# Patient Record
Sex: Female | Born: 1955
Health system: Southern US, Community
[De-identification: ages and names within clinical notes are randomized; demographics above are authoritative.]

## PROBLEM LIST (undated history)

## (undated) DIAGNOSIS — K219 Gastro-esophageal reflux disease without esophagitis: Secondary | ICD-10-CM

## (undated) DIAGNOSIS — I10 Essential (primary) hypertension: Secondary | ICD-10-CM

## (undated) DIAGNOSIS — N390 Urinary tract infection, site not specified: Secondary | ICD-10-CM

## (undated) HISTORY — DX: Gastro-esophageal reflux disease without esophagitis: K21.9

## (undated) HISTORY — DX: Urinary tract infection, site not specified: N39.0

## (undated) HISTORY — DX: Essential (primary) hypertension: I10

---

## 1999-09-26 ENCOUNTER — Other Ambulatory Visit: Admission: RE | Admit: 1999-09-26 | Discharge: 1999-09-26 | Payer: Self-pay | Admitting: Obstetrics & Gynecology

## 2003-07-13 ENCOUNTER — Other Ambulatory Visit: Admission: RE | Admit: 2003-07-13 | Discharge: 2003-07-13 | Payer: Self-pay | Admitting: Obstetrics & Gynecology

## 2003-07-13 LAB — HM MAMMOGRAPHY

## 2003-07-13 LAB — HM PAP SMEAR

## 2019-01-31 ENCOUNTER — Ambulatory Visit: Payer: Self-pay | Admitting: Family Medicine

## 2019-02-07 ENCOUNTER — Ambulatory Visit (INDEPENDENT_AMBULATORY_CARE_PROVIDER_SITE_OTHER): Payer: BC Managed Care – PPO | Admitting: Family Medicine

## 2019-02-07 ENCOUNTER — Encounter: Payer: Self-pay | Admitting: Family Medicine

## 2019-02-07 ENCOUNTER — Other Ambulatory Visit: Payer: Self-pay

## 2019-02-07 VITALS — BP 130/100 | HR 89 | Temp 98.3°F | Ht 65.75 in | Wt 209.8 lb

## 2019-02-07 DIAGNOSIS — R030 Elevated blood-pressure reading, without diagnosis of hypertension: Secondary | ICD-10-CM

## 2019-02-07 DIAGNOSIS — Z0000 Encounter for general adult medical examination without abnormal findings: Secondary | ICD-10-CM | POA: Diagnosis not present

## 2019-02-07 DIAGNOSIS — Z23 Encounter for immunization: Secondary | ICD-10-CM

## 2019-02-07 DIAGNOSIS — Z114 Encounter for screening for human immunodeficiency virus [HIV]: Secondary | ICD-10-CM | POA: Diagnosis not present

## 2019-02-07 DIAGNOSIS — Z1159 Encounter for screening for other viral diseases: Secondary | ICD-10-CM | POA: Diagnosis not present

## 2019-02-07 DIAGNOSIS — Z1211 Encounter for screening for malignant neoplasm of colon: Secondary | ICD-10-CM

## 2019-02-07 LAB — CBC WITH DIFFERENTIAL/PLATELET
Basophils Absolute: 0 10*3/uL (ref 0.0–0.1)
Basophils Relative: 0.6 % (ref 0.0–3.0)
Eosinophils Absolute: 0.1 10*3/uL (ref 0.0–0.7)
Eosinophils Relative: 1.1 % (ref 0.0–5.0)
HCT: 44.3 % (ref 36.0–46.0)
Hemoglobin: 14.6 g/dL (ref 12.0–15.0)
Lymphocytes Relative: 30.3 % (ref 12.0–46.0)
Lymphs Abs: 2 10*3/uL (ref 0.7–4.0)
MCHC: 33 g/dL (ref 30.0–36.0)
MCV: 93.7 fl (ref 78.0–100.0)
Monocytes Absolute: 0.4 10*3/uL (ref 0.1–1.0)
Monocytes Relative: 5.9 % (ref 3.0–12.0)
Neutro Abs: 4.2 10*3/uL (ref 1.4–7.7)
Neutrophils Relative %: 62.1 % (ref 43.0–77.0)
Platelets: 269 10*3/uL (ref 150.0–400.0)
RBC: 4.73 Mil/uL (ref 3.87–5.11)
RDW: 13.6 % (ref 11.5–15.5)
WBC: 6.8 10*3/uL (ref 4.0–10.5)

## 2019-02-07 LAB — TSH: TSH: 2.01 u[IU]/mL (ref 0.35–4.50)

## 2019-02-07 LAB — LIPID PANEL
Cholesterol: 194 mg/dL (ref 0–200)
HDL: 53.7 mg/dL (ref >39.00)
LDL Cholesterol: 123 mg/dL — ABNORMAL HIGH (ref 0–99)
NonHDL: 139.86
Total CHOL/HDL Ratio: 4
Triglycerides: 83 mg/dL (ref 0.0–149.0)
VLDL: 16.6 mg/dL (ref 0.0–40.0)

## 2019-02-07 LAB — COMPREHENSIVE METABOLIC PANEL
ALT: 33 U/L (ref 0–35)
AST: 25 U/L (ref 0–37)
Albumin: 4.5 g/dL (ref 3.5–5.2)
Alkaline Phosphatase: 67 U/L (ref 39–117)
BUN: 16 mg/dL (ref 6–23)
CO2: 27 mEq/L (ref 19–32)
Calcium: 10.4 mg/dL (ref 8.4–10.5)
Chloride: 106 mEq/L (ref 96–112)
Creatinine, Ser: 0.9 mg/dL (ref 0.40–1.20)
GFR: 63.42 mL/min (ref >60.00)
Glucose, Bld: 97 mg/dL (ref 70–99)
Potassium: 4 mEq/L (ref 3.5–5.1)
Sodium: 142 mEq/L (ref 135–145)
Total Bilirubin: 0.4 mg/dL (ref 0.2–1.2)
Total Protein: 6.9 g/dL (ref 6.0–8.3)

## 2019-02-07 NOTE — Patient Instructions (Signed)
Will see you back in 3 months time for blood pressure check. Keep a log for me and bring cuff. If is going up, please let me know before your follow up.   Max heart rate that you should be at is 158 bpm. (220-age)  Get mmg-sheet given. Labs today. cologaurd ordered for colon cancer screening.  Come back for pap smear!   So nice to meet you!   Managing Your Hypertension Hypertension is commonly called high blood pressure. This is when the force of your blood pressing against the walls of your arteries is too strong. Arteries are blood vessels that carry blood from your heart throughout your body. Hypertension forces the heart to work harder to pump blood, and may cause the arteries to become narrow or stiff. Having untreated or uncontrolled hypertension can cause heart attack, stroke, kidney disease, and other problems. What are blood pressure readings? A blood pressure reading consists of a higher number over a lower number. Ideally, your blood pressure should be below 120/80. The first ("top") number is called the systolic pressure. It is a measure of the pressure in your arteries as your heart beats. The second ("bottom") number is called the diastolic pressure. It is a measure of the pressure in your arteries as the heart relaxes. What does my blood pressure reading mean? Blood pressure is classified into four stages. Based on your blood pressure reading, your health care provider may use the following stages to determine what type of treatment you need, if any. Systolic pressure and diastolic pressure are measured in a unit called mm Hg. Normal  Systolic pressure: below 120.  Diastolic pressure: below 80. Elevated  Systolic pressure: 120-129.  Diastolic pressure: below 80. Hypertension stage 1  Systolic pressure: 130-139.  Diastolic pressure: 80-89. Hypertension stage 2  Systolic pressure: 140 or above.  Diastolic pressure: 90 or above. What health risks are associated with  hypertension? Managing your hypertension is an important responsibility. Uncontrolled hypertension can lead to:  A heart attack.  A stroke.  A weakened blood vessel (aneurysm).  Heart failure.  Kidney damage.  Eye damage.  Metabolic syndrome.  Memory and concentration problems. What changes can I make to manage my hypertension? Hypertension can be managed by making lifestyle changes and possibly by taking medicines. Your health care provider will help you make a plan to bring your blood pressure within a normal range. Eating and drinking   Eat a diet that is high in fiber and potassium, and low in salt (sodium), added sugar, and fat. An example eating plan is called the DASH (Dietary Approaches to Stop Hypertension) diet. To eat this way: ? Eat plenty of fresh fruits and vegetables. Try to fill half of your plate at each meal with fruits and vegetables. ? Eat whole grains, such as whole wheat pasta, brown rice, or whole grain bread. Fill about one quarter of your plate with whole grains. ? Eat low-fat diary products. ? Avoid fatty cuts of meat, processed or cured meats, and poultry with skin. Fill about one quarter of your plate with lean proteins such as fish, chicken without skin, beans, eggs, and tofu. ? Avoid premade and processed foods. These tend to be higher in sodium, added sugar, and fat.  Reduce your daily sodium intake. Most people with hypertension should eat less than 1,500 mg of sodium a day.  Limit alcohol intake to no more than 1 drink a day for nonpregnant women and 2 drinks a day for men. One drink  equals 12 oz of beer, 5 oz of wine, or 1 oz of hard liquor. Lifestyle  Work with your health care provider to maintain a healthy body weight, or to lose weight. Ask what an ideal weight is for you.  Get at least 30 minutes of exercise that causes your heart to beat faster (aerobic exercise) most days of the week. Activities may include walking, swimming, or biking.   Include exercise to strengthen your muscles (resistance exercise), such as weight lifting, as part of your weekly exercise routine. Try to do these types of exercises for 30 minutes at least 3 days a week.  Do not use any products that contain nicotine or tobacco, such as cigarettes and e-cigarettes. If you need help quitting, ask your health care provider.  Control any long-term (chronic) conditions you have, such as high cholesterol or diabetes. Monitoring  Monitor your blood pressure at home as told by your health care provider. Your personal target blood pressure may vary depending on your medical conditions, your age, and other factors.  Have your blood pressure checked regularly, as often as told by your health care provider. Working with your health care provider  Review all the medicines you take with your health care provider because there may be side effects or interactions.  Talk with your health care provider about your diet, exercise habits, and other lifestyle factors that may be contributing to hypertension.  Visit your health care provider regularly. Your health care provider can help you create and adjust your plan for managing hypertension. Will I need medicine to control my blood pressure? Your health care provider may prescribe medicine if lifestyle changes are not enough to get your blood pressure under control, and if:  Your systolic blood pressure is 130 or higher.  Your diastolic blood pressure is 80 or higher. Take medicines only as told by your health care provider. Follow the directions carefully. Blood pressure medicines must be taken as prescribed. The medicine does not work as well when you skip doses. Skipping doses also puts you at risk for problems. Contact a health care provider if:  You think you are having a reaction to medicines you have taken.  You have repeated (recurrent) headaches.  You feel dizzy.  You have swelling in your ankles.  You  have trouble with your vision. Get help right away if:  You develop a severe headache or confusion.  You have unusual weakness or numbness, or you feel faint.  You have severe pain in your chest or abdomen.  You vomit repeatedly.  You have trouble breathing. Summary  Hypertension is when the force of blood pumping through your arteries is too strong. If this condition is not controlled, it may put you at risk for serious complications.  Your personal target blood pressure may vary depending on your medical conditions, your age, and other factors. For most people, a normal blood pressure is less than 120/80.  Hypertension is managed by lifestyle changes, medicines, or both. Lifestyle changes include weight loss, eating a healthy, low-sodium diet, exercising more, and limiting alcohol. This information is not intended to replace advice given to you by your health care provider. Make sure you discuss any questions you have with your health care provider. Document Released: 01/31/2012 Document Revised: 08/30/2018 Document Reviewed: 04/05/2016 Elsevier Patient Education  2020 [employer] DASH Eating Plan DASH stands for "Dietary Approaches to Stop Hypertension." The DASH eating plan is a healthy eating plan that has been shown to reduce high  blood pressure (hypertension). It may also reduce your risk for type 2 diabetes, heart disease, and stroke. The DASH eating plan may also help with weight loss. What are tips for following this plan?  General guidelines  Avoid eating more than 2,300 mg (milligrams) of salt (sodium) a day. If you have hypertension, you may need to reduce your sodium intake to 1,500 mg a day.  Limit alcohol intake to no more than 1 drink a day for nonpregnant women and 2 drinks a day for men. One drink equals 12 oz of beer, 5 oz of wine, or 1 oz of hard liquor.  Work with your health care provider to maintain a healthy body weight or to lose weight. Ask what an ideal  weight is for you.  Get at least 30 minutes of exercise that causes your heart to beat faster (aerobic exercise) most days of the week. Activities may include walking, swimming, or biking.  Work with your health care provider or diet and nutrition specialist (dietitian) to adjust your eating plan to your individual calorie needs. Reading food labels   Check food labels for the amount of sodium per serving. Choose foods with less than 5 percent of the Daily Value of sodium. Generally, foods with less than 300 mg of sodium per serving fit into this eating plan.  To find whole grains, look for the word "whole" as the first word in the ingredient list. Shopping  Buy products labeled as "low-sodium" or "no salt added."  Buy fresh foods. Avoid canned foods and premade or frozen meals. Cooking  Avoid adding salt when cooking. Use salt-free seasonings or herbs instead of table salt or sea salt. Check with your health care provider or pharmacist before using salt substitutes.  Do not fry foods. Cook foods using healthy methods such as baking, boiling, grilling, and broiling instead.  Cook with heart-healthy oils, such as olive, canola, soybean, or sunflower oil. Meal planning  Eat a balanced diet that includes: ? 5 or more servings of fruits and vegetables each day. At each meal, try to fill half of your plate with fruits and vegetables. ? Up to 6-8 servings of whole grains each day. ? Less than 6 oz of lean meat, poultry, or fish each day. A 3-oz serving of meat is about the same size as a deck of cards. One egg equals 1 oz. ? 2 servings of low-fat dairy each day. ? A serving of nuts, seeds, or beans 5 times each week. ? Heart-healthy fats. Healthy fats called Omega-3 fatty acids are found in foods such as flaxseeds and coldwater fish, like sardines, salmon, and mackerel.  Limit how much you eat of the following: ? Canned or prepackaged foods. ? Food that is high in trans fat, such as  fried foods. ? Food that is high in saturated fat, such as fatty meat. ? Sweets, desserts, sugary drinks, and other foods with added sugar. ? Full-fat dairy products.  Do not salt foods before eating.  Try to eat at least 2 vegetarian meals each week.  Eat more home-cooked food and less restaurant, buffet, and fast food.  When eating at a restaurant, ask that your food be prepared with less salt or no salt, if possible. What foods are recommended? The items listed may not be a complete list. Talk with your dietitian about what dietary choices are best for you. Grains Whole-grain or whole-wheat bread. Whole-grain or whole-wheat pasta. Brown rice. Orpah Cobb. Bulgur. Whole-grain and low-sodium cereals. Pita  bread. Low-fat, low-sodium crackers. Whole-wheat flour tortillas. Vegetables Fresh or frozen vegetables (raw, steamed, roasted, or grilled). Low-sodium or reduced-sodium tomato and vegetable juice. Low-sodium or reduced-sodium tomato sauce and tomato paste. Low-sodium or reduced-sodium canned vegetables. Fruits All fresh, dried, or frozen fruit. Canned fruit in natural juice (without added sugar). Meat and other protein foods Skinless chicken or [place]. Ground chicken or [place]. Pork with fat trimmed off. Fish and seafood. Egg whites. Dried beans, peas, or lentils. Unsalted nuts, nut butters, and seeds. Unsalted canned beans. Lean cuts of beef with fat trimmed off. Low-sodium, lean deli meat. Dairy Low-fat (1%) or fat-free (skim) milk. Fat-free, low-fat, or reduced-fat cheeses. Nonfat, low-sodium ricotta or cottage cheese. Low-fat or nonfat yogurt. Low-fat, low-sodium cheese. Fats and oils Soft margarine without trans fats. Vegetable oil. Low-fat, reduced-fat, or light mayonnaise and salad dressings (reduced-sodium). Canola, safflower, olive, soybean, and sunflower oils. Avocado. Seasoning and other foods Herbs. Spices. Seasoning mixes without salt. Unsalted popcorn and pretzels.  Fat-free sweets. What foods are not recommended? The items listed may not be a complete list. Talk with your dietitian about what dietary choices are best for you. Grains Baked goods made with fat, such as croissants, muffins, or some breads. Dry pasta or rice meal packs. Vegetables Creamed or fried vegetables. Vegetables in a cheese sauce. Regular canned vegetables (not low-sodium or reduced-sodium). Regular canned tomato sauce and paste (not low-sodium or reduced-sodium). Regular tomato and vegetable juice (not low-sodium or reduced-sodium). Angie Fava. Olives. Fruits Canned fruit in a light or heavy syrup. Fried fruit. Fruit in cream or butter sauce. Meat and other protein foods Fatty cuts of meat. Ribs. Fried meat. Berniece Salines. Sausage. Bologna and other processed lunch meats. Salami. Fatback. Hotdogs. Bratwurst. Salted nuts and seeds. Canned beans with added salt. Canned or smoked fish. Whole eggs or egg yolks. Chicken or [place] with skin. Dairy Whole or 2% milk, cream, and half-and-half. Whole or full-fat cream cheese. Whole-fat or sweetened yogurt. Full-fat cheese. Nondairy creamers. Whipped toppings. Processed cheese and cheese spreads. Fats and oils Butter. Stick margarine. Lard. Shortening. Ghee. Bacon fat. Tropical oils, such as coconut, palm kernel, or palm oil. Seasoning and other foods Salted popcorn and pretzels. Onion salt, garlic salt, seasoned salt, table salt, and sea salt. Worcestershire sauce. Tartar sauce. Barbecue sauce. Teriyaki sauce. Soy sauce, including reduced-sodium. Steak sauce. Canned and packaged gravies. Fish sauce. Oyster sauce. Cocktail sauce. Horseradish that you find on the shelf. Ketchup. Mustard. Meat flavorings and tenderizers. Bouillon cubes. Hot sauce and Tabasco sauce. Premade or packaged marinades. Premade or packaged taco seasonings. Relishes. Regular salad dressings. Where to find more information:  National Heart, Lung, and [place]: https://wilson-eaton.com/   American Heart Association: www.heart.org Summary  The DASH eating plan is a healthy eating plan that has been shown to reduce high blood pressure (hypertension). It may also reduce your risk for type 2 diabetes, heart disease, and stroke.  With the DASH eating plan, you should limit salt (sodium) intake to 2,300 mg a day. If you have hypertension, you may need to reduce your sodium intake to 1,500 mg a day.  When on the DASH eating plan, aim to eat more fresh fruits and vegetables, whole grains, lean proteins, low-fat dairy, and heart-healthy fats.  Work with your health care provider or diet and nutrition specialist (dietitian) to adjust your eating plan to your individual calorie needs. This information is not intended to replace advice given to you by your health care provider. Make sure you discuss any questions you  have with your health care provider. Document Released: 04/27/2011 Document Revised: 04/20/2017 Document Reviewed: 05/01/2016 Elsevier Patient Education  2020 [employer].

## 2019-02-07 NOTE — Progress Notes (Signed)
Patient: Erin Monroe MRN: [id number] DOB: 12-Apr-1956 PCP: No primary care provider on file.     Subjective:  Chief Complaint  Patient presents with  . Establish Care  . blood pressure issues    HPI: The patient is a 63 y.o. female who presents today for establishing care and to discuss issues with elevated blood pressure at times.   The patient is a 63 year old female who presents today for annual exam. She denies any changes to past medical history. There have been no recent hospitalizations. They are following a well balanced diet and exercise plan. Weight has been decreasing steadily. Concerns for blood pressure issues.  Has not been seen at physician's office in a while.   Elevated blood pressure: she has no hx of elevated blood pressure. She went to [facility] to check her blood pressure in august and it was 140/90. She stopped salt and has lost 10 pounds. She is trying to eat much better and cutting down on carbs. She is starting to exercise at least 3x/week. Denies any vision changes, chest pain, shortness of breath, swelling in legs.   No family hx of colon or breast cancer. Mom and dad still living. Father hx of CAD with one stent at age 16 years.   Mmg: overdue Pap smear: overdue  Tdap: not up to date Colonoscopy: cologaurd 2x. Long time. Overdue.  Flu: today   Review of Systems  Constitutional: Negative for chills, fatigue and fever.  HENT: Negative for congestion, dental problem, ear pain, hearing loss, postnasal drip, rhinorrhea, sore throat and trouble swallowing.   Eyes: Negative for visual disturbance.  Respiratory: Negative for cough, chest tightness and shortness of breath.   Cardiovascular: Negative for chest pain, palpitations and leg swelling.  Gastrointestinal: Negative for abdominal pain, blood in stool, diarrhea, nausea and vomiting.  Endocrine: Negative for cold intolerance, polydipsia, polyphagia and polyuria.  Genitourinary: Negative for dysuria,  frequency, hematuria and urgency.  Musculoskeletal: Negative for arthralgias, back pain, myalgias and neck pain.  Skin: Negative for rash.  Neurological: Negative for dizziness and headaches.  Psychiatric/Behavioral: Negative for dysphoric mood and sleep disturbance. The patient is nervous/anxious.     Allergies Patient has No Known Allergies.  Past Medical History Patient  has a past medical history of UTI (urinary tract infection).  Surgical History Patient  has a past surgical history that includes Cesarean section (1988).  Family History Pateint's family history is not on file.  Social History Patient  reports that she has never smoked. She has never used smokeless tobacco. She reports current alcohol use. She reports that she does not use drugs.    Objective: Vitals:   02/07/19 0947 02/07/19 1138  BP: 128/90 (!) 130/100  Pulse: 89   Temp: 98.3 F (36.8 C)   TempSrc: Skin   SpO2: 99%   Weight: 209 lb 12.8 oz (95.2 kg)   Height: 5' 5.75" (1.67 m)     Body mass index is 34.12 kg/m.  Physical Exam Vitals signs reviewed.  Constitutional:      Appearance: Normal appearance. She is obese.  HENT:     Head: Normocephalic and atraumatic.     Right Ear: Tympanic membrane, ear canal and external ear normal.     Left Ear: Tympanic membrane, ear canal and external ear normal.     Nose: Nose normal.     Mouth/Throat:     Mouth: Mucous membranes are moist.  Eyes:     Extraocular Movements: Extraocular movements intact.  Conjunctiva/sclera: Conjunctivae normal.     Pupils: Pupils are equal, round, and reactive to light.  Neck:     Musculoskeletal: Normal range of motion and neck supple.     Vascular: No carotid bruit.  Cardiovascular:     Rate and Rhythm: Normal rate and regular rhythm.     Pulses: Normal pulses.  Pulmonary:     Effort: Pulmonary effort is normal.     Breath sounds: Normal breath sounds.  Abdominal:     General: Abdomen is flat. Bowel sounds are  normal.     Palpations: Abdomen is soft.  Skin:    General: Skin is warm and dry.     Capillary Refill: Capillary refill takes less than 2 seconds.  Neurological:     General: No focal deficit present.     Mental Status: She is alert and oriented to person, place, and time.  Psychiatric:        Mood and Affect: Mood normal.        Behavior: Behavior normal.    [facility] 02[facility]2019  Decreased Interest 0  Down, Depressed, Hopeless 0  PHQ - 2 Score 0       Assessment/plan: 1. Annual physical exam Routine lab work today. Discussed all of her HM issues that need addressed. Tdap/flu today. cologuard ordered for colon cancer screening. mmg information given. We will do pap smear at 3 month f/u. Continue with weight loss and healthy eating. Advised f/u with dentist. Repeat annual in one year.  Patient counseling [x]    Nutrition: Stressed importance of moderation in sodium/caffeine intake, saturated fat and cholesterol, caloric balance, sufficient intake of fresh fruits, vegetables, fiber, calcium, iron, and 1 mg of folate supplement per day (for females capable of pregnancy).  [x]    Stressed the importance of regular exercise.   []    Substance Abuse: Discussed cessation/primary prevention of tobacco, alcohol, or other drug use; driving or other dangerous activities under the influence; availability of treatment for abuse.   [x]    Injury prevention: Discussed safety belts, safety helmets, smoke detector, smoking near bedding or upholstery.   [x]    Sexuality: Discussed sexually transmitted diseases, partner selection, use of condoms, avoidance of unintended pregnancy  and contraceptive alternatives.  [x]    Dental health: Discussed importance of regular tooth brushing, flossing, and dental visits.  [x]    Health maintenance and immunizations reviewed. Please refer to Health maintenance section.    - CBC with Differential/Platelet - Comprehensive metabolic panel - Lipid panel -  TSH  2. Elevated blood pressure reading without diagnosis of hypertension Diastolic above goal. Will give her 3 months to continue to work on diet and exercise as she is losing weight and really work hard on changing lifestyle. I want her to start a blood pressure log as well. If elevated next visit will start medication/ekg/urine. If readings are going up, I want her to call and let me know. Precautions given and uncontrolled HTN risks discussed.   3. Encounter for screening for HIV  - HIV Antibody (routine testing w rflx)  4. Encounter for hepatitis C screening test for low risk patient  - Hepatitis C antibody  5. Need for Tdap vaccination  - Tdap vaccine greater than or equal to 7yo IM  6. Screening for colon cancer  - Cologuard  7. Need for immunization against influenza  - Flu Vaccine QUAD 36+ mos IM    Return in about 3 months (around 05/09/2019) for blood pressure check and pap smear. .Marland Kitchen  Orma Flaming, MD [place]   02/07/2019

## 2019-02-10 LAB — HIV ANTIBODY (ROUTINE TESTING W REFLEX): HIV 1&2 Ab, 4th Generation: NONREACTIVE

## 2019-02-10 LAB — HEPATITIS C ANTIBODY
Hepatitis C Ab: NONREACTIVE
SIGNAL TO CUT-OFF: 0.03 (ref <1.00)

## 2019-02-11 ENCOUNTER — Telehealth: Payer: Self-pay | Admitting: *Deleted

## 2019-02-11 NOTE — Telephone Encounter (Signed)
Patient called for results:  Notes recorded by Orma Flaming, MD on 02/10/2019 at 10:18 AM EDT  Please let her know the following...  Lab work is overall excellent and to goal including sugar, liver/kidney function, thyroid and hemoglobin. Only abnormality is cholesterol. Her LDL, the bad cholesterol is at 123. Goal is less than 100. All other cholesterol values are within range. Her 10 year risk of heart attack or stroke is 4.2% so no medication needed at this point. ( I start medication at 7.5%) Keep working on weight loss, exercise and watching blood pressure.   Overall awesome job. See her back for blood pressure check in 3 months. Nice to meet her!  Dr. Rogers Blocker   Patient notified of results and PCP recommendations.

## 2019-02-17 DIAGNOSIS — Z1211 Encounter for screening for malignant neoplasm of colon: Secondary | ICD-10-CM | POA: Diagnosis not present

## 2019-02-17 LAB — COLOGUARD

## 2019-03-26 ENCOUNTER — Other Ambulatory Visit: Payer: Self-pay

## 2019-03-26 DIAGNOSIS — Z20822 Contact with and (suspected) exposure to covid-19: Secondary | ICD-10-CM

## 2019-03-27 LAB — NOVEL CORONAVIRUS, NAA: SARS-CoV-2, NAA: NOT DETECTED

## 2019-04-04 ENCOUNTER — Other Ambulatory Visit: Payer: Self-pay | Admitting: Family Medicine

## 2019-04-04 DIAGNOSIS — Z1231 Encounter for screening mammogram for malignant neoplasm of breast: Secondary | ICD-10-CM

## 2019-04-10 ENCOUNTER — Other Ambulatory Visit: Payer: Self-pay

## 2019-04-10 ENCOUNTER — Ambulatory Visit
Admission: RE | Admit: 2019-04-10 | Discharge: 2019-04-10 | Disposition: A | Discharge status: Home / Self Care | Payer: BC Managed Care – PPO | Source: Ambulatory Visit | Attending: Family Medicine | Admitting: Family Medicine

## 2019-04-10 DIAGNOSIS — Z1231 Encounter for screening mammogram for malignant neoplasm of breast: Secondary | ICD-10-CM | POA: Diagnosis not present

## 2019-05-08 NOTE — Progress Notes (Signed)
SUBJECTIVE:  63 y.o. female for annual routine Pap and checkup. Also rechecking her blood pressure.   Menarche was around age 51 years, but she can't really remember. She had normal periods that she can remember. Menopause at age 21 years. No history of HRT. No history of abnormal mmgs. No history of STDs. Last pap smear was 20 years ago. No hx of abnormal pap smear. No vaginal complaints and no sexual complaints. utd on cscope and mmg. No family hx of breast or colon cancer.   Elevated blood pressure: saw her for her annual and her bp was 130/100. I asked her to keep a log and she has brought that with her. Readings are all to goal and range from 99-130/60-88. She also brought cuff to make sure calibrated correctly. Her blood pressure is always elevated at doctor's offices.   Current Outpatient Medications  Medication Sig Dispense Refill  . omeprazole (PRILOSEC) 20 MG capsule Take 20 mg by mouth daily.     No current facility-administered medications for this visit.   Allergies: Patient has no known allergies.  No LMP recorded (lmp unknown). Patient is postmenopausal.  ROS:  Feeling well. No dyspnea or chest pain on exertion.  No abdominal pain, change in bowel habits, black or bloody stools.  No urinary tract symptoms. GYN ROS: no breast pain or new or enlarging lumps on self exam, no vaginal bleeding, no discharge or pelvic pain. No neurological complaints.  OBJECTIVE:  The patient appears well, alert, oriented x 3, in no distress. BP 140/82   Pulse 66   Temp 98 F (36.7 C)   Ht 5' 5.75" (1.67 m)   Wt 208 lb 6.4 oz (94.5 kg)   LMP  (LMP Unknown)   SpO2 96%   BMI 33.89 kg/m  ENT normal.  Neck supple. No adenopathy or thyromegaly. PERLA. Lungs are clear, good air entry, no wheezes, rhonchi or rales. S1 and S2 normal, no murmurs, regular rate and rhythm. Abdomen soft without tenderness, guarding, mass or organomegaly. Extremities show no edema, normal peripheral pulses. Neurological is  normal, no focal findings.  BREAST EXAM: breasts appear normal, no suspicious masses, no skin or nipple changes or axillary nodes  PELVIC EXAM: normal external genitalia, vulva, vagina, cervix, uterus and adnexa  ASSESSMENT:  well woman dexa White coat HTN   PLAN:  pap smear and dexa ordered. Otherwise utd on HM.  Blood pressure to goal all at home. Elevated at doctor's office. Cuff is calibrated correctly.  return annually or prn  Chaperone present for pap/exam.   This visit occurred during the SARS-CoV-2 public health emergency.  Safety protocols were in place, including screening questions prior to the visit, additional usage of staff PPE, and extensive cleaning of exam room while observing appropriate contact time as indicated for disinfecting solutions.   Orma Flaming, MD [place]

## 2019-05-09 ENCOUNTER — Ambulatory Visit: Payer: BC Managed Care – PPO | Admitting: Family Medicine

## 2019-05-09 ENCOUNTER — Other Ambulatory Visit: Payer: Self-pay

## 2019-05-09 ENCOUNTER — Encounter: Payer: Self-pay | Admitting: Family Medicine

## 2019-05-09 ENCOUNTER — Other Ambulatory Visit (HOSPITAL_COMMUNITY)
Admission: RE | Admit: 2019-05-09 | Discharge: 2019-05-09 | Disposition: A | Discharge status: Home / Self Care | Payer: BC Managed Care – PPO | Source: Ambulatory Visit | Attending: Family Medicine | Admitting: Family Medicine

## 2019-05-09 VITALS — BP 140/82 | HR 66 | Temp 98.0°F | Ht 65.75 in | Wt 208.4 lb

## 2019-05-09 DIAGNOSIS — Z01419 Encounter for gynecological examination (general) (routine) without abnormal findings: Secondary | ICD-10-CM | POA: Diagnosis not present

## 2019-05-09 DIAGNOSIS — N959 Unspecified menopausal and perimenopausal disorder: Secondary | ICD-10-CM

## 2019-05-09 DIAGNOSIS — R030 Elevated blood-pressure reading, without diagnosis of hypertension: Secondary | ICD-10-CM

## 2019-05-09 NOTE — Patient Instructions (Addendum)
Your home blood pressure log is wonderful and your cuff is correct. You just have the "white coat hypetension."  Thanks again for checking this.   Pap smear today. Only thing you need is a bone scan and I ordered this today for you!  Great job!  Merry christmas!   Health Maintenance  Topic Date Due  . PAP SMEAR-Modifier  07/12/2006  . MAMMOGRAM  04/09/2021  . Fecal DNA (Cologuard)  02/16/2022  . TETANUS/TDAP  02/06/2029  . INFLUENZA VACCINE  Completed  . Hepatitis C Screening  Completed  . HIV Screening  Completed

## 2019-05-12 LAB — CYTOLOGY - PAP
Comment: NEGATIVE
Diagnosis: NEGATIVE
High risk HPV: NEGATIVE

## 2019-06-03 ENCOUNTER — Other Ambulatory Visit: Payer: Self-pay | Admitting: Family Medicine

## 2019-06-03 DIAGNOSIS — N959 Unspecified menopausal and perimenopausal disorder: Secondary | ICD-10-CM

## 2019-08-18 ENCOUNTER — Other Ambulatory Visit: Payer: BC Managed Care – PPO

## 2019-10-30 ENCOUNTER — Other Ambulatory Visit: Payer: Self-pay

## 2019-10-30 ENCOUNTER — Ambulatory Visit
Admission: RE | Admit: 2019-10-30 | Discharge: 2019-10-30 | Disposition: A | Discharge status: Home / Self Care | Payer: BC Managed Care – PPO | Source: Ambulatory Visit | Attending: Family Medicine | Admitting: Family Medicine

## 2019-10-30 DIAGNOSIS — Z780 Asymptomatic menopausal state: Secondary | ICD-10-CM | POA: Diagnosis not present

## 2019-10-30 DIAGNOSIS — M85852 Other specified disorders of bone density and structure, left thigh: Secondary | ICD-10-CM | POA: Diagnosis not present

## 2019-10-30 DIAGNOSIS — N959 Unspecified menopausal and perimenopausal disorder: Secondary | ICD-10-CM

## 2019-11-01 ENCOUNTER — Encounter: Payer: Self-pay | Admitting: Family Medicine

## 2019-11-01 DIAGNOSIS — M8580 Other specified disorders of bone density and structure, unspecified site: Secondary | ICD-10-CM | POA: Insufficient documentation

## 2020-09-18 IMAGING — MG DIGITAL SCREENING BILAT W/ CAD
4 series · 4 of 4 positions shown · non-contrast
Comparison: None.

CLINICAL DATA: Screening.

EXAM:
DIGITAL SCREENING BILATERAL MAMMOGRAM WITH CAD

[R MLO]
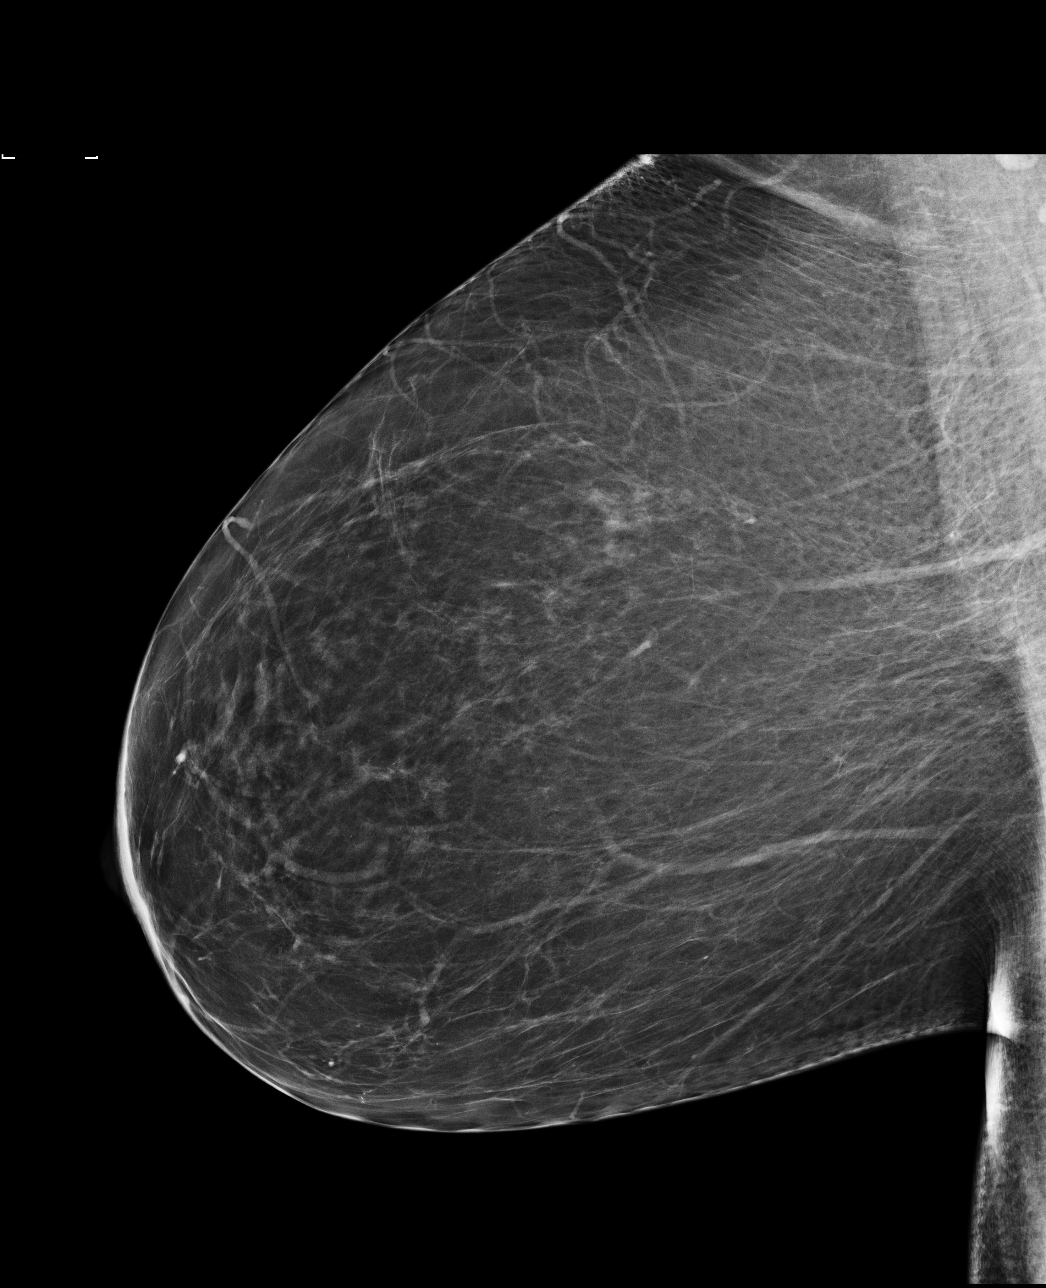

[L MLO]
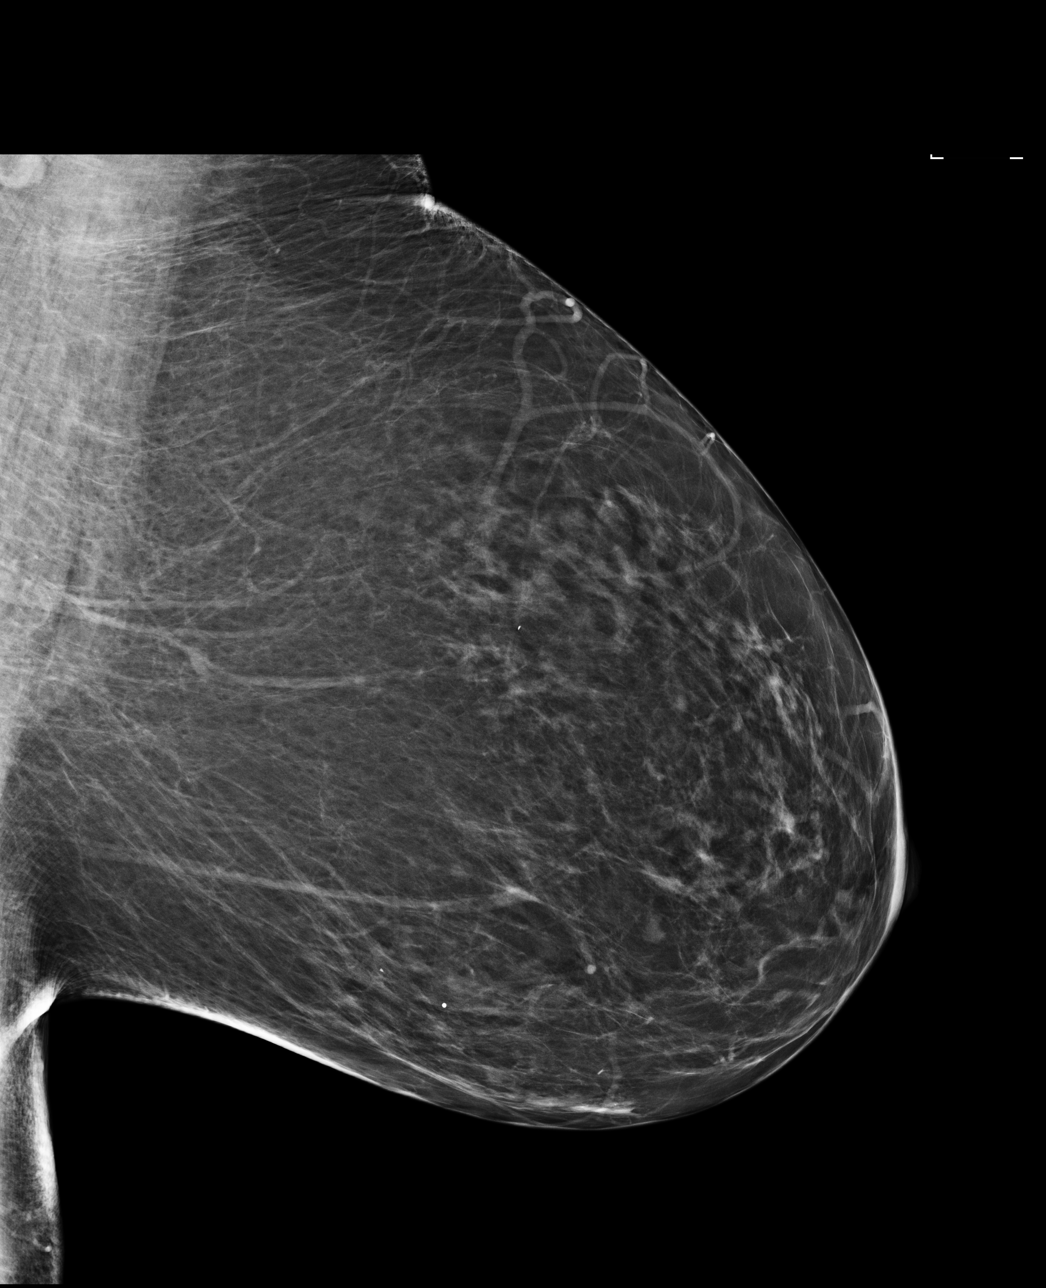

[L CC]
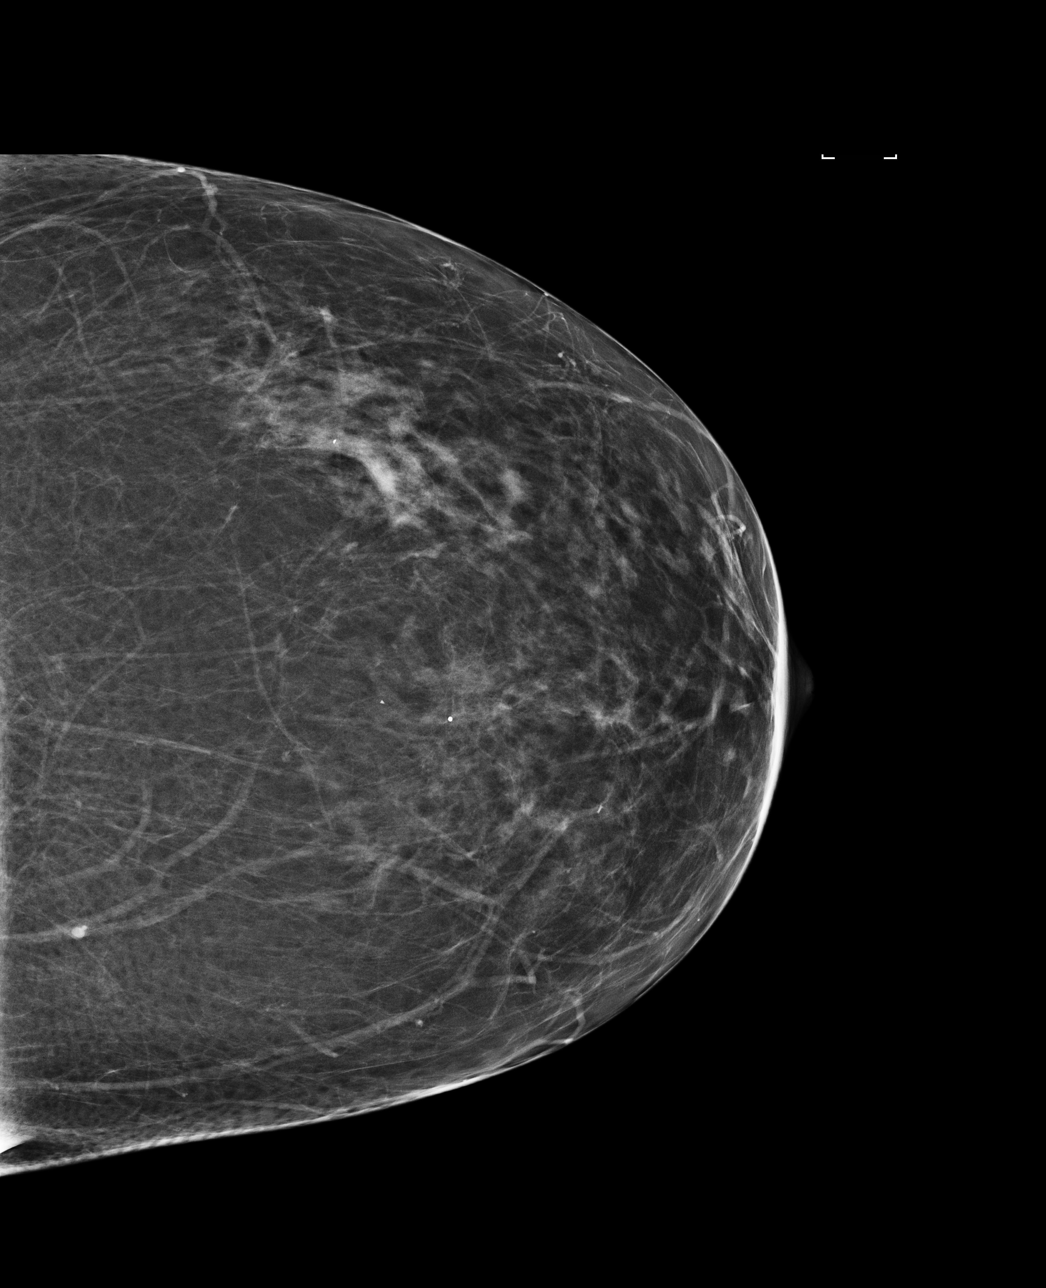

[R CC]
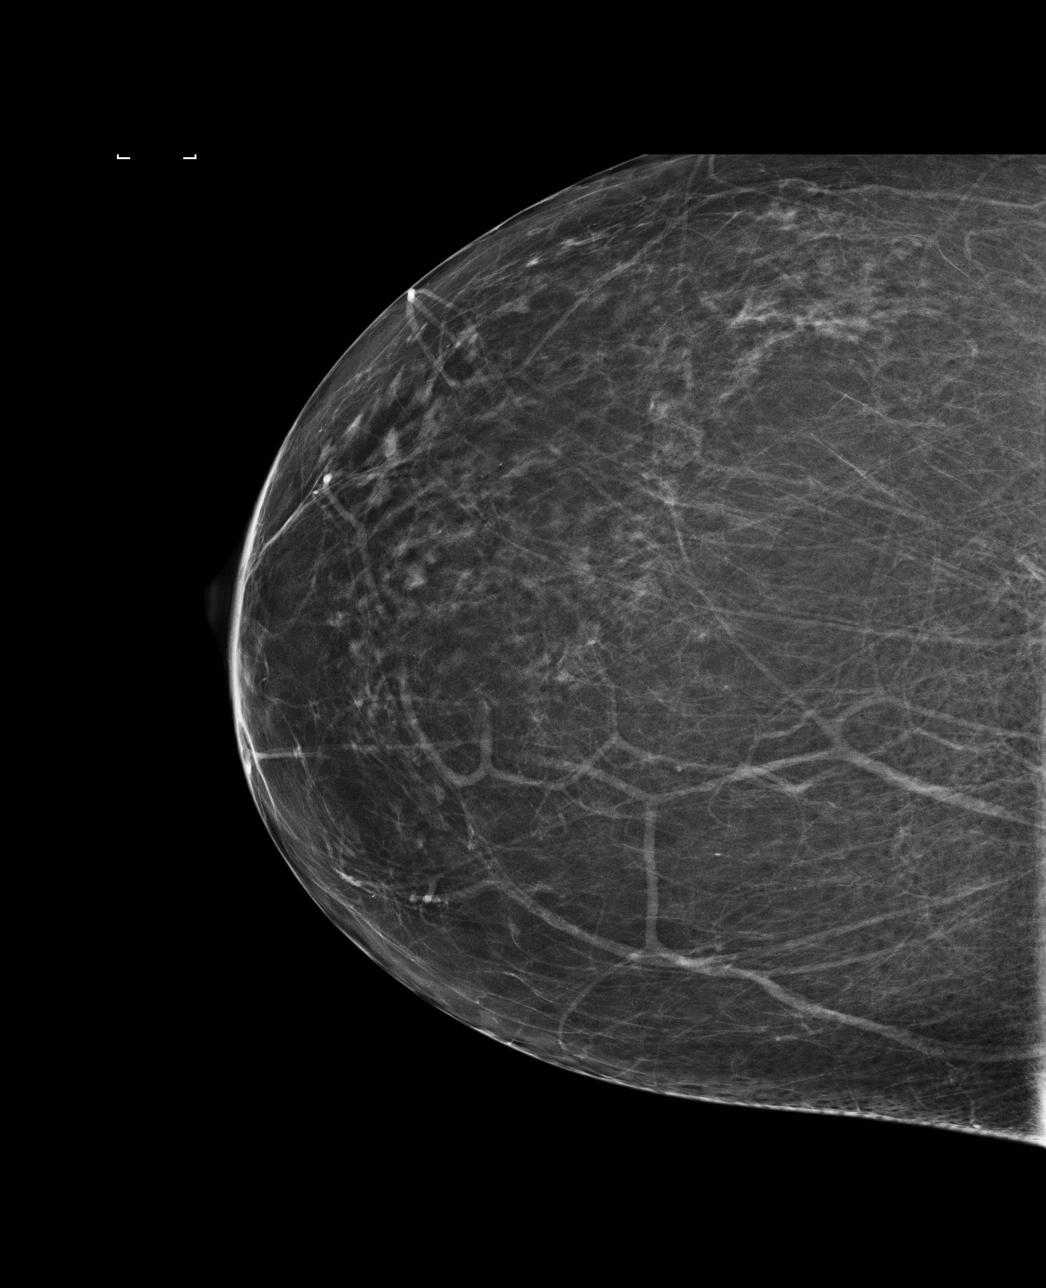

[4 of 4 positions shown; findings below may reference images not displayed]

ACR Breast Density Category b: There are scattered areas of
fibroglandular density.
FINDINGS: There are no findings suspicious for malignancy. Images were
processed with CAD.
IMPRESSION: No mammographic evidence of malignancy. A result letter of this
screening mammogram will be mailed directly to the patient.

RECOMMENDATION:
Screening mammogram in one year. (Code:[id number])

BI-RADS CATEGORY  1: Negative.

## 2021-03-29 ENCOUNTER — Encounter: Payer: BC Managed Care – PPO | Admitting: Physician Assistant

## 2021-07-14 ENCOUNTER — Encounter: Payer: BC Managed Care – PPO | Admitting: Physician Assistant

## 2022-05-10 ENCOUNTER — Encounter: Payer: Self-pay | Admitting: Physician Assistant

## 2022-05-10 ENCOUNTER — Ambulatory Visit (INDEPENDENT_AMBULATORY_CARE_PROVIDER_SITE_OTHER): Payer: Medicare Other | Admitting: Physician Assistant

## 2022-05-10 VITALS — BP 138/90 | HR 65 | Temp 97.1°F | Ht 64.0 in | Wt 221.2 lb

## 2022-05-10 DIAGNOSIS — Z1211 Encounter for screening for malignant neoplasm of colon: Secondary | ICD-10-CM

## 2022-05-10 DIAGNOSIS — M8580 Other specified disorders of bone density and structure, unspecified site: Secondary | ICD-10-CM

## 2022-05-10 DIAGNOSIS — Z23 Encounter for immunization: Secondary | ICD-10-CM | POA: Diagnosis not present

## 2022-05-10 DIAGNOSIS — R030 Elevated blood-pressure reading, without diagnosis of hypertension: Secondary | ICD-10-CM

## 2022-05-10 DIAGNOSIS — E669 Obesity, unspecified: Secondary | ICD-10-CM | POA: Diagnosis not present

## 2022-05-10 DIAGNOSIS — Z0000 Encounter for general adult medical examination without abnormal findings: Secondary | ICD-10-CM | POA: Diagnosis not present

## 2022-05-10 DIAGNOSIS — E785 Hyperlipidemia, unspecified: Secondary | ICD-10-CM

## 2022-05-10 LAB — COMPREHENSIVE METABOLIC PANEL
ALT: 43 U/L — ABNORMAL HIGH (ref 0–35)
AST: 29 U/L (ref 0–37)
Albumin: 4.5 g/dL (ref 3.5–5.2)
Alkaline Phosphatase: 62 U/L (ref 39–117)
BUN: 15 mg/dL (ref 6–23)
CO2: 28 mEq/L (ref 19–32)
Calcium: 10 mg/dL (ref 8.4–10.5)
Chloride: 104 mEq/L (ref 96–112)
Creatinine, Ser: 0.94 mg/dL (ref 0.40–1.20)
GFR: 63.74 mL/min (ref >60.00)
Glucose, Bld: 89 mg/dL (ref 70–99)
Potassium: 4 mEq/L (ref 3.5–5.1)
Sodium: 141 mEq/L (ref 135–145)
Total Bilirubin: 0.5 mg/dL (ref 0.2–1.2)
Total Protein: 7 g/dL (ref 6.0–8.3)

## 2022-05-10 LAB — CBC WITH DIFFERENTIAL/PLATELET
Basophils Absolute: 0 10*3/uL (ref 0.0–0.1)
Basophils Relative: 0.7 % (ref 0.0–3.0)
Eosinophils Absolute: 0.1 10*3/uL (ref 0.0–0.7)
Eosinophils Relative: 1.8 % (ref 0.0–5.0)
HCT: 43.1 % (ref 36.0–46.0)
Hemoglobin: 14.7 g/dL (ref 12.0–15.0)
Lymphocytes Relative: 34.9 % (ref 12.0–46.0)
Lymphs Abs: 2.5 10*3/uL (ref 0.7–4.0)
MCHC: 34.1 g/dL (ref 30.0–36.0)
MCV: 93.5 fl (ref 78.0–100.0)
Monocytes Absolute: 0.5 10*3/uL (ref 0.1–1.0)
Monocytes Relative: 7.1 % (ref 3.0–12.0)
Neutro Abs: 4 10*3/uL (ref 1.4–7.7)
Neutrophils Relative %: 55.5 % (ref 43.0–77.0)
Platelets: 281 10*3/uL (ref 150.0–400.0)
RBC: 4.61 Mil/uL (ref 3.87–5.11)
RDW: 13.9 % (ref 11.5–15.5)
WBC: 7.1 10*3/uL (ref 4.0–10.5)

## 2022-05-10 LAB — LIPID PANEL
Cholesterol: 200 mg/dL (ref 0–200)
HDL: 54 mg/dL (ref >39.00)
LDL Cholesterol: 127 mg/dL — ABNORMAL HIGH (ref 0–99)
NonHDL: 146.47
Total CHOL/HDL Ratio: 4
Triglycerides: 98 mg/dL (ref 0.0–149.0)
VLDL: 19.6 mg/dL (ref 0.0–40.0)

## 2022-05-10 NOTE — Addendum Note (Signed)
Addended by: Jimmye Norman on: 05/10/2022 11:47 AM   Modules accepted: Orders

## 2022-05-10 NOTE — Progress Notes (Signed)
Subjective:    Erin Monroe is a 66 y.o. female and is here for a comprehensive physical exam.  HPI  Health Maintenance Due  Topic Date Due   Medicare Annual Wellness (AWV)  Never done   MAMMOGRAM  04/09/2021   Pneumonia Vaccine 51+ Years old (1 - PCV) Never done   COVID-19 Vaccine (4 - 2023-24 season) 01/20/2022   Fecal DNA (Cologuard)  02/16/2022   PAP SMEAR-Modifier  05/08/2022    Acute Concerns: None  Chronic Issues: Osteopenia Taking calcium + D supplementation. Bone density scan 10/30/2019: The BMD measured at [facility] Ne[facility] Left is 0.838 g/cm2 with a T-score of -1.4   Elevated blood pressure Currently not taking any medications. At home blood pressure readings are: not checked. Patient denies chest pain, SOB, blurred vision, dizziness, unusual headaches, lower leg swelling.  Denies excessive caffeine intake, stimulant usage, excessive alcohol intake, or increase in salt consumption.  BP Readings from Last 3 Encounters:  05/10/22 (!) 138/90  05/09/19 140/82  02/07/19 (!) 130/100     States always runs high in office. Not on any medications for this. BP Readings from Last 5 Encounters:  05/10/22 (!) 138/90  05/09/19 140/82  02/07/19 (!) 130/100  Reports minimal dependent edema when she eats more salt. Denies any chest pain or shortness of breath.   Hyperlipidemia No family Hx. Would like to improve diet for management. Lab Results  Component Value Date   CHOL 194 02/07/2019   HDL 53.70 02/07/2019   LDLCALC 123 (H) 02/07/2019   TRIG 83.0 02/07/2019   CHOLHDL 4 02/07/2019      Health Maintenance: Immunizations -- Received flu and pneumonia vaccines today- otherwise UTD Colonoscopy -- Cologuard overdue- agreeable to repeat Mammogram -- Last on 04/10/2019 negative for malignancy- due for repeat scan PAP -- Last completed on 05/09/2019 was normal -- up to date Bone Density -- Last in 10/2019 showed osteopenia- due for repeat scan Diet -- Rare  alcohol use- 1 drink per week or less, joined weight watchers 3 months ago with intentional 10lb weight loss, enjoys snacking and sweets, drinks water or diet drink Exercise -- No formal exercise regimen or strength training, occasionally walks at the track at work and would like to improve this  Sleep habits -- Good Mood -- Good  UTD with dentist? - No- planning to see dentist in January 2024 UTD with eye doctor? - Yes  Weight history: Wt Readings from Last 10 Encounters:  05/10/22 221 lb 4 oz (100.4 kg)  05/09/19 208 lb 6.4 oz (94.5 kg)  02/07/19 209 lb 12.8 oz (95.2 kg)   Body mass index is 37.98 kg/m. No LMP recorded (lmp unknown). Patient is postmenopausal.  Alcohol use:  reports current alcohol use.  Tobacco use:  Tobacco Use: Low Risk  (05/10/2022)   Patient History    Smoking Tobacco Use: Never    Smokeless Tobacco Use: Never    Passive Exposure: Not on file   Eligible for lung cancer screening? no     05/10/2022   10:28 AM  Depression screen PHQ 2/9  Decreased Interest 0  Down, Depressed, Hopeless 0  PHQ - 2 Score 0     Other providers/specialists: Patient Care Team: Jarold Motto, [place] as PCP - General (Physician Assistant) Annamaria Helling, MD as Consulting Physician (Obstetrics and Gynecology)    PMHx, SurgHx, SocialHx, Medications, and Allergies were reviewed in the Visit Navigator and updated as appropriate.   Past Medical History:  Diagnosis Date  GERD (gastroesophageal reflux disease)    High blood pressure    UTI (urinary tract infection)      Past Surgical History:  Procedure Laterality Date   CESAREAN SECTION  1988     Family History  Problem Relation Age of Onset   Depression Sister    Alcohol abuse Maternal Grandfather    Breast cancer Paternal Aunt    Cancer Neg Hx     Social History   Tobacco Use   Smoking status: Never   Smokeless tobacco: Never  Vaping Use   Vaping Use: Never used  Substance Use Topics   Alcohol use:  Yes    Comment: socially   Drug use: Never    Review of Systems:   Review of Systems  Constitutional:  Negative for chills, fever, malaise/fatigue and weight loss.  HENT:  Negative for hearing loss, sinus pain and sore throat.   Respiratory:  Negative for cough and hemoptysis.   Cardiovascular:  Negative for chest pain, palpitations, leg swelling and PND.  Gastrointestinal:  Negative for abdominal pain, constipation, diarrhea, heartburn, nausea and vomiting.  Genitourinary:  Negative for dysuria, frequency and urgency.  Musculoskeletal:  Negative for back pain, myalgias and neck pain.  Skin:  Negative for itching and rash.  Neurological:  Negative for dizziness, tingling, seizures and headaches.  Endo/Heme/Allergies:  Negative for polydipsia.  Psychiatric/Behavioral:  Negative for depression. The patient is not nervous/anxious.     Objective:   BP (!) 138/90 (BP Location: Left Arm, Patient Position: Sitting, Cuff Size: Large)   Pulse 65   Temp (!) 97.1 F (36.2 C) (Temporal)   Ht 5\' 4"  (1.626 m)   Wt 221 lb 4 oz (100.4 kg)   LMP  (LMP Unknown) Comment: LMP 2010  SpO2 96%   BMI 37.98 kg/m  Body mass index is 37.98 kg/m.   General Appearance:    Alert, cooperative, no distress, appears stated age  Head:    Normocephalic, without obvious abnormality, atraumatic  Eyes:    PERRL, conjunctiva/corneas clear, EOM's intact, fundi    benign, both eyes  Ears:    Normal TM's and external ear canals, both ears  Nose:   Nares normal, septum midline, mucosa normal, no drainage    or sinus tenderness  Throat:   Lips, mucosa, and tongue normal; teeth and gums normal  Neck:   Supple, symmetrical, trachea midline, no adenopathy;    thyroid:  no enlargement/tenderness/nodules; no carotid   bruit or JVD  Back:     Symmetric, no curvature, ROM normal, no CVA tenderness  Lungs:     Clear to auscultation bilaterally, respirations unlabored  Chest Wall:    No tenderness or deformity   Heart:     Regular rate and rhythm, S1 and S2 normal, no murmur, rub or gallop  Breast Exam:    Deferred   Abdomen:     Soft, non-tender, bowel sounds active all four quadrants,    no masses, no organomegaly  Genitalia:    Deferred   Extremities:   Extremities normal, atraumatic, no cyanosis or edema  Pulses:   2+ and symmetric all extremities  Skin:   Skin color, texture, turgor normal, no rashes or lesions  Lymph nodes:   Cervical, supraclavicular, and axillary nodes normal  Neurologic:   CNII-XII intact, normal strength, sensation and reflexes    throughout    Assessment/Plan:   Routine physical examination Today patient counseled on age appropriate routine health concerns for screening and prevention, each  reviewed and up to date or declined. Immunizations reviewed and up to date or declined. Labs ordered and reviewed. Risk factors for depression reviewed and negative. Hearing function and visual acuity are intact. ADLs screened and addressed as needed. Functional ability and level of safety reviewed and appropriate. Education, counseling and referrals performed based on assessed risks today. Patient provided with a copy of personalized plan for preventive services.  Osteopenia, unspecified location Update DEXA Continue calcium and vit D Recommend weight bearing exercise - she is going to go to a local gym  Obesity, unspecified classification, unspecified obesity type, unspecified whether serious comorbidity present Continue efforts at healthy lifestyle  Elevated blood pressure reading Above goal No evidence of end organ damage Continue to monitor and follow-up in 3 months, BP log provided If any chest pain, sob, HA, she was advised to let us know  Special screening for malignant neoplasms, colon Overdue for cologuard - ordered  Need for immunization against influenza Up to date  Hyperlipidemia, unspecified hyperlipidemia type Up to date  I,Alexis Herring,acting as a scribe for  [employer], PA.,have documented all relevant documentation on the behalf of Jarold Motto, PA,as directed by  Jarold Motto, PA while in the presence of Jarold Motto, [place].  I, Jarold Motto, [place], have reviewed all documentation for this visit. The documentation on 05/10/22 for the exam, diagnosis, procedures, and orders are all accurate and complete.  Jarold Motto, PA-C [facility]

## 2022-05-10 NOTE — Patient Instructions (Addendum)
It was great to see you!  Please schedule a mammogram and bone density study at [facility] (see handout) Goal is to get 150 min of exercise of per week Keep an eye on your blood pressure -- follow-up in 3 months for this Please go to the dentist We will order a cologuard test and bone density scan today, as well as blood work  Please go to the lab for blood work.   Our office will call you with your results unless you have chosen to receive results via MyChart.  If your blood work is normal we will follow-up each year for physicals and as scheduled for chronic medical problems.  If anything is abnormal we will treat accordingly and get you in for a follow-up.  Take care,  Lelon Mast

## 2022-05-11 ENCOUNTER — Encounter: Payer: Self-pay | Admitting: Physician Assistant

## 2022-05-11 DIAGNOSIS — R7989 Other specified abnormal findings of blood chemistry: Secondary | ICD-10-CM | POA: Insufficient documentation

## 2022-05-11 DIAGNOSIS — E785 Hyperlipidemia, unspecified: Secondary | ICD-10-CM | POA: Insufficient documentation

## 2022-05-17 ENCOUNTER — Other Ambulatory Visit: Payer: Self-pay | Admitting: Physician Assistant

## 2022-05-17 DIAGNOSIS — Z1231 Encounter for screening mammogram for malignant neoplasm of breast: Secondary | ICD-10-CM

## 2022-05-31 DIAGNOSIS — Z1211 Encounter for screening for malignant neoplasm of colon: Secondary | ICD-10-CM | POA: Diagnosis not present

## 2022-06-10 LAB — COLOGUARD: COLOGUARD: NEGATIVE

## 2022-07-12 ENCOUNTER — Ambulatory Visit
Admission: RE | Admit: 2022-07-12 | Discharge: 2022-07-12 | Disposition: A | Discharge status: Home / Self Care | Payer: Medicare Other | Source: Ambulatory Visit | Attending: Physician Assistant | Admitting: Physician Assistant

## 2022-07-12 DIAGNOSIS — Z1231 Encounter for screening mammogram for malignant neoplasm of breast: Secondary | ICD-10-CM

## 2022-08-02 NOTE — Progress Notes (Signed)
Erin Monroe is a 67 y.o. female here for a follow up of a pre-existing problem.  History of Present Illness:   Chief Complaint  Patient presents with   Hypertension    HPI  Elevated Blood Pressure Currently not taking any medications. At home blood pressure readings are: 130/92 avg.  Denies chest pain, SOB, blurred vision, dizziness, unusual headaches, lower leg swelling, excessive caffeine intake, stimulant usage, excessive alcohol intake, or increase in salt consumption. Blood pressure normal today at 126/86. Has been eating low sugar products, limiting dairy, red meat, alcohol. Taking turmeric, milk thistle, omega 3.  Past Medical History:  Diagnosis Date   GERD (gastroesophageal reflux disease)    High blood pressure    UTI (urinary tract infection)      Social History   Tobacco Use   Smoking status: Never   Smokeless tobacco: Never  Vaping Use   Vaping Use: Never used  Substance Use Topics   Alcohol use: Yes    Comment: socially   Drug use: Never    Past Surgical History:  Procedure Laterality Date   CESAREAN SECTION  1988    Family History  Problem Relation Age of Onset   Depression Sister    Alcohol abuse Maternal Grandfather    Breast cancer Paternal Aunt    Cancer Neg Hx     Allergies  Allergen Reactions   Monistat [Miconazole] Rash    Current Medications:   Current Outpatient Medications:    Calcium Carb-Cholecalciferol (CALCIUM 600 + D PO), Take 1 tablet by mouth 2 (two) times daily., Disp: , Rfl:    MILK THISTLE PO, Take by mouth., Disp: , Rfl:    Multiple Vitamins-Minerals (CENTRUM SILVER 50+WOMEN PO), Take 1 tablet by mouth daily in the afternoon., Disp: , Rfl:    Omega-3 Fatty Acids (OMEGA 3 PO), Take by mouth., Disp: , Rfl:    omeprazole (PRILOSEC) 20 MG capsule, Take 20 mg by mouth daily., Disp: , Rfl:    Turmeric (QC TUMERIC COMPLEX PO), Take by mouth., Disp: , Rfl:    Review of Systems:   Review of Systems  Constitutional:   Negative for fever and malaise/fatigue.  HENT:  Negative for congestion.   Eyes:  Negative for blurred vision.  Respiratory:  Negative for cough and shortness of breath.   Cardiovascular:  Negative for chest pain, palpitations and leg swelling.  Gastrointestinal:  Negative for vomiting.  Musculoskeletal:  Negative for back pain.  Skin:  Negative for rash.  Neurological:  Negative for dizziness, loss of consciousness and headaches.    Vitals:   Vitals:   08/09/22 1357  BP: 126/86  Pulse: 76  Resp: 16  Temp: 98.2 F (36.8 C)  TempSrc: Temporal  SpO2: 98%  Weight: 213 lb 6.4 oz (96.8 kg)  Height: 5\' 4"  (1.626 m)     Body mass index is 36.63 kg/m.  Physical Exam:   Physical Exam Vitals and nursing note reviewed.  Constitutional:      General: She is not in acute distress.    Appearance: She is well-developed. She is not ill-appearing or toxic-appearing.  Cardiovascular:     Rate and Rhythm: Normal rate and regular rhythm.     Pulses: Normal pulses.     Heart sounds: Normal heart sounds, S1 normal and S2 normal.  Pulmonary:     Effort: Pulmonary effort is normal.     Breath sounds: Normal breath sounds.  Skin:    General: Skin is warm and dry.  Neurological:     Mental Status: She is alert.     GCS: GCS eye subscore is 4. GCS verbal subscore is 5. GCS motor subscore is 6.  Psychiatric:        Speech: Speech normal.        Behavior: Behavior normal. Behavior is cooperative.     Assessment and Plan:   Elevated blood pressure reading Normotensive Continue healthy lifestyle and exercise  Elevated LFTs Update blood work today and provide recommendation  Hyperlipidemia, unspecified hyperlipidemia type Update lipid panel and provide recommendations   I,Alexander Ruley,acting as a scribe for [employer], PA.,have documented all relevant documentation on the behalf of Inda Coke, PA,as directed by  Inda Coke, PA while in the presence of Inda Coke, [place].   I, Inda Coke, [place], have reviewed all documentation for this visit. The documentation on 08/09/22 for the exam, diagnosis, procedures, and orders are all accurate and complete.   Inda Coke, PA-C

## 2022-08-09 ENCOUNTER — Ambulatory Visit (INDEPENDENT_AMBULATORY_CARE_PROVIDER_SITE_OTHER): Payer: Medicare Other | Admitting: Physician Assistant

## 2022-08-09 ENCOUNTER — Encounter: Payer: Self-pay | Admitting: Physician Assistant

## 2022-08-09 VITALS — BP 126/86 | HR 76 | Temp 98.2°F | Resp 16 | Ht 64.0 in | Wt 213.4 lb

## 2022-08-09 DIAGNOSIS — E785 Hyperlipidemia, unspecified: Secondary | ICD-10-CM

## 2022-08-09 DIAGNOSIS — R7989 Other specified abnormal findings of blood chemistry: Secondary | ICD-10-CM | POA: Diagnosis not present

## 2022-08-09 DIAGNOSIS — R030 Elevated blood-pressure reading, without diagnosis of hypertension: Secondary | ICD-10-CM | POA: Diagnosis not present

## 2022-08-09 NOTE — Patient Instructions (Signed)
It was great to see you!  Add in psyllium!  Keep up the good work, I am very proud of you!  Let's follow-up in December for your annual visit, sooner if you have concerns.  Take care,  Inda Coke PA-C

## 2022-08-10 ENCOUNTER — Other Ambulatory Visit: Payer: Self-pay | Admitting: Physician Assistant

## 2022-08-10 DIAGNOSIS — R7989 Other specified abnormal findings of blood chemistry: Secondary | ICD-10-CM

## 2022-08-10 DIAGNOSIS — E785 Hyperlipidemia, unspecified: Secondary | ICD-10-CM

## 2022-08-10 LAB — COMPREHENSIVE METABOLIC PANEL
ALT: 53 U/L — ABNORMAL HIGH (ref 0–35)
AST: 32 U/L (ref 0–37)
Albumin: 4.4 g/dL (ref 3.5–5.2)
Alkaline Phosphatase: 63 U/L (ref 39–117)
BUN: 11 mg/dL (ref 6–23)
CO2: 24 mEq/L (ref 19–32)
Calcium: 10.1 mg/dL (ref 8.4–10.5)
Chloride: 104 mEq/L (ref 96–112)
Creatinine, Ser: 0.82 mg/dL (ref 0.40–1.20)
GFR: 74.96 mL/min (ref >60.00)
Glucose, Bld: 91 mg/dL (ref 70–99)
Potassium: 4 mEq/L (ref 3.5–5.1)
Sodium: 140 mEq/L (ref 135–145)
Total Bilirubin: 0.4 mg/dL (ref 0.2–1.2)
Total Protein: 7 g/dL (ref 6.0–8.3)

## 2022-08-10 LAB — LIPID PANEL
Cholesterol: 208 mg/dL — ABNORMAL HIGH (ref 0–200)
HDL: 55.8 mg/dL (ref >39.00)
LDL Cholesterol: 135 mg/dL — ABNORMAL HIGH (ref 0–99)
NonHDL: 152.34
Total CHOL/HDL Ratio: 4
Triglycerides: 86 mg/dL (ref 0.0–149.0)
VLDL: 17.2 mg/dL (ref 0.0–40.0)

## 2022-11-07 ENCOUNTER — Other Ambulatory Visit: Payer: Self-pay | Admitting: Physician Assistant

## 2022-11-07 DIAGNOSIS — M8580 Other specified disorders of bone density and structure, unspecified site: Secondary | ICD-10-CM

## 2022-11-08 ENCOUNTER — Inpatient Hospital Stay: Admission: RE | Admit: 2022-11-08 | Payer: Medicare Other | Source: Ambulatory Visit

## 2022-12-26 ENCOUNTER — Ambulatory Visit (INDEPENDENT_AMBULATORY_CARE_PROVIDER_SITE_OTHER): Payer: Medicare Other

## 2022-12-26 VITALS — Wt 213.0 lb

## 2022-12-26 DIAGNOSIS — Z0000 Encounter for general adult medical examination without abnormal findings: Secondary | ICD-10-CM

## 2022-12-26 NOTE — Patient Instructions (Signed)
Erin Monroe , Thank you for taking time to come for your Medicare Wellness Visit. I appreciate your ongoing commitment to your health goals. Please review the following plan we discussed and let me know if I can assist you in the future.   Referrals/Orders/Follow-Ups/Clinician Recommendations: lose weight   This is a list of the screening recommended for you and due dates:  Health Maintenance  Topic Date Due   Zoster (Shingles) Vaccine (1 of 2) Never done   COVID-19 Vaccine (4 - 2023-24 season) 01/20/2022   DEXA scan (bone density measurement)  10/30/2022   Flu Shot  12/21/2022   Medicare Annual Wellness Visit  12/26/2023   Mammogram  07/12/2024   Cologuard (Stool DNA test)  05/31/2025   DTaP/Tdap/Td vaccine (2 - Td or Tdap) 02/06/2029   Pneumonia Vaccine  Completed   Hepatitis C Screening  Completed   HPV Vaccine  Aged Out    Advanced directives: (Copy Requested) Please bring a copy of your health care power of attorney and living will to the office to be added to your chart at your convenience.  Next Medicare Annual Wellness Visit scheduled for next year: Yes  Preventive Care 15 Years and Older, Female Preventive care refers to lifestyle choices and visits with your health care provider that can promote health and wellness. What does preventive care include? A yearly physical exam. This is also called an annual well check. Dental exams once or twice a year. Routine eye exams. Ask your health care provider how often you should have your eyes checked. Personal lifestyle choices, including: Daily care of your teeth and gums. Regular physical activity. Eating a healthy diet. Avoiding tobacco and drug use. Limiting alcohol use. Practicing safe sex. Taking low-dose aspirin every day. Taking vitamin and mineral supplements as recommended by your health care provider. What happens during an annual well check? The services and screenings done by your health care provider during your  annual well check will depend on your age, overall health, lifestyle risk factors, and family history of disease. Counseling  Your health care provider may ask you questions about your: Alcohol use. Tobacco use. Drug use. Emotional well-being. Home and relationship well-being. Sexual activity. Eating habits. History of falls. Memory and ability to understand (cognition). Work and work Astronomer. Reproductive health. Screening  You may have the following tests or measurements: Height, weight, and BMI. Blood pressure. Lipid and cholesterol levels. These may be checked every 5 years, or more frequently if you are over 2 years old. Skin check. Lung cancer screening. You may have this screening every year starting at age 49 if you have a 30-pack-year history of smoking and currently smoke or have quit within the past 15 years. Fecal occult blood test (FOBT) of the stool. You may have this test every year starting at age 36. Flexible sigmoidoscopy or colonoscopy. You may have a sigmoidoscopy every 5 years or a colonoscopy every 10 years starting at age 89. Hepatitis C blood test. Hepatitis B blood test. Sexually transmitted disease (STD) testing. Diabetes screening. This is done by checking your blood sugar (glucose) after you have not eaten for a while (fasting). You may have this done every 1-3 years. Bone density scan. This is done to screen for osteoporosis. You may have this done starting at age 99. Mammogram. This may be done every 1-2 years. Talk to your health care provider about how often you should have regular mammograms. Talk with your health care provider about your test results, treatment  options, and if necessary, the need for more tests. Vaccines  Your health care provider may recommend certain vaccines, such as: Influenza vaccine. This is recommended every year. Tetanus, diphtheria, and acellular pertussis (Tdap, Td) vaccine. You may need a Td booster every 10  years. Zoster vaccine. You may need this after age 79. Pneumococcal 13-valent conjugate (PCV13) vaccine. One dose is recommended after age 8. Pneumococcal polysaccharide (PPSV23) vaccine. One dose is recommended after age 87. Talk to your health care provider about which screenings and vaccines you need and how often you need them. This information is not intended to replace advice given to you by your health care provider. Make sure you discuss any questions you have with your health care provider. Document Released: 06/04/2015 Document Revised: 01/26/2016 Document Reviewed: 03/09/2015 Elsevier Interactive Patient Education  2017 [employer].  Fall Prevention in the Home Falls can cause injuries. They can happen to people of all ages. There are many things you can do to make your home safe and to help prevent falls. What can I do on the outside of my home? Regularly fix the edges of walkways and driveways and fix any cracks. Remove anything that might make you trip as you walk through a door, such as a raised step or threshold. Trim any bushes or trees on the path to your home. Use bright outdoor lighting. Clear any walking paths of anything that might make someone trip, such as rocks or tools. Regularly check to see if handrails are loose or broken. Make sure that both sides of any steps have handrails. Any raised decks and porches should have guardrails on the edges. Have any leaves, snow, or ice cleared regularly. Use sand or salt on walking paths during winter. Clean up any spills in your garage right away. This includes oil or grease spills. What can I do in the bathroom? Use night lights. Install grab bars by the toilet and in the tub and shower. Do not use towel bars as grab bars. Use non-skid mats or decals in the tub or shower. If you need to sit down in the shower, use a plastic, non-slip stool. Keep the floor dry. Clean up any water that spills on the floor as soon as it  happens. Remove soap buildup in the tub or shower regularly. Attach bath mats securely with double-sided non-slip rug tape. Do not have throw rugs and other things on the floor that can make you trip. What can I do in the bedroom? Use night lights. Make sure that you have a light by your bed that is easy to reach. Do not use any sheets or blankets that are too big for your bed. They should not hang down onto the floor. Have a firm chair that has side arms. You can use this for support while you get dressed. Do not have throw rugs and other things on the floor that can make you trip. What can I do in the kitchen? Clean up any spills right away. Avoid walking on wet floors. Keep items that you use a lot in easy-to-reach places. If you need to reach something above you, use a strong step stool that has a grab bar. Keep electrical cords out of the way. Do not use floor polish or wax that makes floors slippery. If you must use wax, use non-skid floor wax. Do not have throw rugs and other things on the floor that can make you trip. What can I do with my stairs? Do not  leave any items on the stairs. Make sure that there are handrails on both sides of the stairs and use them. Fix handrails that are broken or loose. Make sure that handrails are as long as the stairways. Check any carpeting to make sure that it is firmly attached to the stairs. Fix any carpet that is loose or worn. Avoid having throw rugs at the top or bottom of the stairs. If you do have throw rugs, attach them to the floor with carpet tape. Make sure that you have a light switch at the top of the stairs and the bottom of the stairs. If you do not have them, ask someone to add them for you. What else can I do to help prevent falls? Wear shoes that: Do not have high heels. Have rubber bottoms. Are comfortable and fit you well. Are closed at the toe. Do not wear sandals. If you use a stepladder: Make sure that it is fully opened.  Do not climb a closed stepladder. Make sure that both sides of the stepladder are locked into place. Ask someone to hold it for you, if possible. Clearly mark and make sure that you can see: Any grab bars or handrails. First and last steps. Where the edge of each step is. Use tools that help you move around (mobility aids) if they are needed. These include: Canes. Walkers. Scooters. Crutches. Turn on the lights when you go into a dark area. Replace any light bulbs as soon as they burn out. Set up your furniture so you have a clear path. Avoid moving your furniture around. If any of your floors are uneven, fix them. If there are any pets around you, be aware of where they are. Review your medicines with your doctor. Some medicines can make you feel dizzy. This can increase your chance of falling. Ask your doctor what other things that you can do to help prevent falls. This information is not intended to replace advice given to you by your health care provider. Make sure you discuss any questions you have with your health care provider. Document Released: 03/04/2009 Document Revised: 10/14/2015 Document Reviewed: 06/12/2014 Elsevier Interactive Patient Education  2017 [employer].

## 2022-12-26 NOTE — Progress Notes (Signed)
Subjective:   Erin Monroe is a 67 y.o. female who presents for an Initial Medicare Annual Wellness Visit.  Visit Complete: Virtual  I connected with  Erin Monroe on 12/26/22 by a audio enabled telemedicine application and verified that I am speaking with the correct person using two identifiers.  Patient Location: Home  Provider Location: Office/Clinic  I discussed the limitations of evaluation and management by telemedicine. The patient expressed understanding and agreed to proceed.  Vital Signs: Unable to obtain new vitals due to this being a telehealth visit.   Review of Systems     Cardiac Risk Factors include: advanced age (>6men, >54 women);dyslipidemia;obesity (BMI >30kg/m2)     Objective:    Today's Vitals   12/26/22 1603  Weight: 213 lb (96.6 kg)   Body mass index is 36.56 kg/m.     12/26/2022    4:07 PM  Advanced Directives  Does Patient Have a Medical Advance Directive? No  Would patient like information on creating a medical advance directive? No - Patient declined    Current Medications (verified) Outpatient Encounter Medications as of 12/26/2022  Medication Sig   Calcium Carb-Cholecalciferol (CALCIUM 600 + D PO) Take 1 tablet by mouth 2 (two) times daily.   Multiple Vitamins-Minerals (CENTRUM SILVER 50+WOMEN PO) Take 1 tablet by mouth daily in the afternoon.   Omega-3 Fatty Acids (OMEGA 3 PO) Take by mouth.   omeprazole (PRILOSEC) 20 MG capsule Take 20 mg by mouth daily.   Turmeric (QC TUMERIC COMPLEX PO) Take by mouth.   [DISCONTINUED] MILK THISTLE PO Take by mouth.   No facility-administered encounter medications on file as of 12/26/2022.    Allergies (verified) Monistat [miconazole]   History: Past Medical History:  Diagnosis Date   GERD (gastroesophageal reflux disease)    High blood pressure    UTI (urinary tract infection)    Past Surgical History:  Procedure Laterality Date   CESAREAN SECTION  1988   Family History   Problem Relation Age of Onset   Depression Sister    Alcohol abuse Maternal Grandfather    Breast cancer Paternal Aunt    Cancer Neg Hx    Social History   Socioeconomic History   Marital status: Married    Spouse name: Not on file   Number of children: Not on file   Years of education: Not on file   Highest education level: Not on file  Occupational History   Not on file  Tobacco Use   Smoking status: Never   Smokeless tobacco: Never  Vaping Use   Vaping status: Never Used  Substance and Sexual Activity   Alcohol use: Yes    Comment: socially   Drug use: Never   Sexual activity: Not Currently    Partners: Male  Other Topics Concern   Not on file  Social History Narrative   Scheduling/membership for [employer]   Two sons in [place]   3 grandbabys and 1 on the way   Lives with husband   Fun: sells [employer]   Social Determinants of Health   Financial Resource Strain: Low Risk  (12/26/2022)   Overall Financial Resource Strain (CARDIA)    Difficulty of Paying Living Expenses: Not hard at all  Food Insecurity: No Food Insecurity (12/26/2022)   Hunger Vital Sign    Worried About Running Out of Food in the Last Year: Never true    Ran Out of Food in the Last Year: Never true  Transportation Needs: No Transportation Needs (12/26/2022)   PRAPARE - Administrator, Civil Service (Medical): No    Lack of Transportation (Non-Medical): No  Physical Activity: Insufficiently Active (12/26/2022)   Exercise Vital Sign    Days of Exercise per Week: 2 days    Minutes of Exercise per Session: 20 min  Stress: No Stress Concern Present (12/26/2022)   [employer] of Occupational Health - Occupational Stress Questionnaire    Feeling of Stress : Not at all  Social Connections: Moderately Integrated (12/26/2022)   Social Connection and Isolation Panel [NHANES]    Frequency of Communication with Friends and Family: More than three times a week    Frequency of Social  Gatherings with Friends and Family: More than three times a week    Attends Religious Services: More than 4 times per year    Active Member of [employer] or Organizations: No    Attends Engineer, structural: Never    Marital Status: Married    Tobacco Counseling Counseling given: Not Answered   Clinical Intake:  Pre-visit preparation completed: Yes  Pain : No/denies pain     BMI - recorded: 36.56 Nutritional Status: BMI > 30  Obese Nutritional Risks: None Diabetes: No  How often do you need to have someone help you when you read instructions, pamphlets, or other written materials from your doctor or pharmacy?: 1 - Never  Interpreter Needed?: No  Information entered by :: Lanier Ensign, LPN   Activities of Daily Living    12/26/2022    4:04 PM  In your present state of health, do you have any difficulty performing the following activities:  Hearing? 0  Vision? 0  Difficulty concentrating or making decisions? 0  Walking or climbing stairs? 0  Dressing or bathing? 0  Doing errands, shopping? 0  Preparing Food and eating ? N  Using the Toilet? N  In the past six months, have you accidently leaked urine? N  Do you have problems with loss of bowel control? N  Managing your Medications? N  Managing your Finances? N  Housekeeping or managing your Housekeeping? N    Patient Care Team: Jarold Motto, [place] as PCP - General (Physician Assistant) Annamaria Helling, MD as Consulting Physician (Obstetrics and Gynecology)  Indicate any recent Medical Services you may have received from other than [facility] providers in the past year (date may be approximate).     Assessment:   This is a routine wellness examination for [place].  Hearing/Vision screen Hearing Screening - Comments:: Pt denies any hearing issues  Vision Screening - Comments:: Pt follows up with Dr Charise Killian for aNNUAL EYE EXAms   Dietary issues and exercise activities discussed:     Goals Addressed              This Visit's Progress    Patient Stated       Losing weight        Depression Screen    12/26/2022    4:05 PM 05/10/2022   10:28 AM 02/07/2019    9:46 AM  PHQ 2/9 Scores  PHQ - 2 Score 0 0 0    Fall Risk    12/26/2022    4:07 PM  Fall Risk   Falls in the past year? 0  Number falls in past yr: 0  Injury with Fall? 0  Risk for fall due to : Impaired vision  Follow up Falls prevention discussed    MEDICARE RISK AT HOME:  Medicare  Risk at Home - 12/26/22 1607     Any stairs in or around the home? Yes    If so, are there any without handrails? No    Home free of loose throw rugs in walkways, pet beds, electrical cords, etc? Yes    Adequate lighting in your home to reduce risk of falls? Yes    Life alert? No    Use of a cane, walker or w/c? No    Grab bars in the bathroom? No    Shower chair or bench in shower? No    Elevated toilet seat or a handicapped toilet? No             TIMED UP AND GO:  Was the test performed? No    Cognitive Function:        12/26/2022    4:09 PM  6CIT Screen  What Year? 0 points  What month? 0 points  What time? 0 points  Count back from 20 0 points  Months in reverse 0 points  Repeat phrase 0 points  Total Score 0 points    Immunizations Immunization History  Administered Date(s) Administered   Fluad Quad(high Dose 65+) 05/10/2022   Influenza,inj,Quad PF,6+ Mos 02/07/2019   Moderna SARS-COV2 Booster Vaccination 06/15/2020   Moderna Sars-Covid-2 Vaccination 08/07/2019, 09/08/2019   PNEUMOCOCCAL CONJUGATE-20 05/10/2022   Tdap 02/07/2019    TDAP status: Up to date  Flu Vaccine status: Due, Education has been provided regarding the importance of this vaccine. Advised may receive this vaccine at local pharmacy or Health Dept. Aware to provide a copy of the vaccination record if obtained from local pharmacy or Health Dept. Verbalized acceptance and understanding.  Pneumococcal vaccine status: Up to date  Covid-19  vaccine status: Completed vaccines  Qualifies for Shingles Vaccine? Yes   Zostavax completed No   Shingrix Completed?: No.    Education has been provided regarding the importance of this vaccine. Patient has been advised to call insurance company to determine out of pocket expense if they have not yet received this vaccine. Advised may also receive vaccine at local pharmacy or Health Dept. Verbalized acceptance and understanding.  Screening Tests Health Maintenance  Topic Date Due   Zoster Vaccines- Shingrix (1 of 2) Never done   COVID-19 Vaccine (4 - 2023-24 season) 01/20/2022   DEXA SCAN  10/30/2022   INFLUENZA VACCINE  12/21/2022   Medicare Annual Wellness (AWV)  12/26/2023   MAMMOGRAM  07/12/2024   Fecal DNA (Cologuard)  05/31/2025   DTaP/Tdap/Td (2 - Td or Tdap) 02/06/2029   Pneumonia Vaccine 40+ Years old  Completed   Hepatitis C Screening  Completed   HPV VACCINES  Aged Out    Health Maintenance  Health Maintenance Due  Topic Date Due   Zoster Vaccines- Shingrix (1 of 2) Never done   COVID-19 Vaccine (4 - 2023-24 season) 01/20/2022   DEXA SCAN  10/30/2022   INFLUENZA VACCINE  12/21/2022    Colorectal cancer screening: Type of screening: Cologuard. Completed 05/31/22. Repeat every 3 years  Mammogram status: Completed 07/12/22. Repeat every year  Bone Density status: Ordered for 05/25/23. Pt provided with contact info and advised to call to schedule appt.   Additional Screening:  Hepatitis C Screening:  Completed 02/07/19  Vision Screening: Recommended annual ophthalmology exams for early detection of glaucoma and other disorders of the eye. Is the patient up to date with their annual eye exam?  Yes  Who is the provider or what is the name  of the office in which the patient attends annual eye exams? Dr Charise Killian  If pt is not established with a provider, would they like to be referred to a provider to establish care? No .   Dental Screening: Recommended annual dental  exams for proper oral hygiene   Community Resource Referral / Chronic Care Management: CRR required this visit?  No   CCM required this visit?  No     Plan:     I have personally reviewed and noted the following in the patient's chart:   Medical and social history Use of alcohol, tobacco or illicit drugs  Current medications and supplements including opioid prescriptions. Patient is not currently taking opioid prescriptions. Functional ability and status Nutritional status Physical activity Advanced directives List of other physicians Hospitalizations, surgeries, and ER visits in previous 12 months Vitals Screenings to include cognitive, depression, and falls Referrals and appointments  In addition, I have reviewed and discussed with patient certain preventive protocols, quality metrics, and best practice recommendations. A written personalized care plan for preventive services as well as general preventive health recommendations were provided to patient.     Marzella Schlein, LPN   05/27/1094   After Visit Summary: (MyChart) Due to this being a telephonic visit, the after visit summary with patients personalized plan was offered to patient via MyChart   Nurse Notes: none

## 2023-05-25 ENCOUNTER — Ambulatory Visit
Admission: RE | Admit: 2023-05-25 | Discharge: 2023-05-25 | Disposition: A | Discharge status: Home / Self Care | Payer: Medicare Other | Source: Ambulatory Visit | Attending: Physician Assistant | Admitting: Physician Assistant

## 2023-05-25 DIAGNOSIS — M8580 Other specified disorders of bone density and structure, unspecified site: Secondary | ICD-10-CM

## 2023-05-25 DIAGNOSIS — N958 Other specified menopausal and perimenopausal disorders: Secondary | ICD-10-CM | POA: Diagnosis not present

## 2023-05-25 DIAGNOSIS — E2839 Other primary ovarian failure: Secondary | ICD-10-CM | POA: Diagnosis not present

## 2023-05-25 DIAGNOSIS — M8588 Other specified disorders of bone density and structure, other site: Secondary | ICD-10-CM | POA: Diagnosis not present

## 2023-12-27 ENCOUNTER — Ambulatory Visit

## 2023-12-27 VITALS — Ht 65.0 in | Wt 213.0 lb

## 2023-12-27 DIAGNOSIS — Z0000 Encounter for general adult medical examination without abnormal findings: Secondary | ICD-10-CM | POA: Diagnosis not present

## 2023-12-27 NOTE — Progress Notes (Signed)
 Subjective:   Erin Monroe is a 68 y.o. who presents for a Medicare Wellness preventive visit.  As a reminder, Annual Wellness Visits don't include a physical exam, and some assessments may be limited, especially if this visit is performed virtually. We may recommend an in-person follow-up visit with your provider if needed.  Visit Complete: Virtual I connected with  Erin Monroe on 12/27/23 by a audio enabled telemedicine application and verified that I am speaking with the correct person using two identifiers.  Patient Location: Home  Provider Location: Office/Clinic  I discussed the limitations of evaluation and management by telemedicine. The patient expressed understanding and agreed to proceed.  Vital Signs: Because this visit was a virtual/telehealth visit, some criteria may be missing or patient reported. Any vitals not documented were not able to be obtained and vitals that have been documented are patient reported.  VideoDeclined- This patient declined Librarian, academic. Therefore the visit was completed with audio only.  Persons Participating in Visit: Patient.  AWV Questionnaire: No: Patient Medicare AWV questionnaire was not completed prior to this visit.  Cardiac Risk Factors include: advanced age (>54men, >65 women);diabetes mellitus;obesity (BMI >30kg/m2)     Objective:    Today's Vitals   12/27/23 1037  Weight: 213 lb (96.6 kg)  Height: 5' 5 (1.651 m)   Body mass index is 35.45 kg/m.     12/27/2023   10:39 AM 12/26/2022    4:07 PM  Advanced Directives  Does Patient Have a Medical Advance Directive? No No  Would patient like information on creating a medical advance directive? No - Patient declined No - Patient declined    Current Medications (verified) Outpatient Encounter Medications as of 12/27/2023  Medication Sig   Calcium Carb-Cholecalciferol (CALCIUM 600 + D PO) Take 1 tablet by mouth 2 (two) times daily.    Multiple Vitamins-Minerals (CENTRUM SILVER 50+WOMEN PO) Take 1 tablet by mouth daily in the afternoon.   Omega-3 Fatty Acids (OMEGA 3 PO) Take by mouth.   omeprazole (PRILOSEC) 20 MG capsule Take 20 mg by mouth daily.   Turmeric (QC TUMERIC COMPLEX PO) Take by mouth.   No facility-administered encounter medications on file as of 12/27/2023.    Allergies (verified) Monistat [miconazole]   History: Past Medical History:  Diagnosis Date   GERD (gastroesophageal reflux disease)    High blood pressure    UTI (urinary tract infection)    Past Surgical History:  Procedure Laterality Date   CESAREAN SECTION  1988   Family History  Problem Relation Age of Onset   Depression Sister    Alcohol abuse Maternal Grandfather    Breast cancer Paternal Aunt    Cancer Neg Hx    Social History   Socioeconomic History   Marital status: Married    Spouse name: Not on file   Number of children: Not on file   Years of education: Not on file   Highest education level: Not on file  Occupational History   Not on file  Tobacco Use   Smoking status: Never   Smokeless tobacco: Never  Vaping Use   Vaping status: Never Used  Substance and Sexual Activity   Alcohol use: Yes    Comment: socially   Drug use: Never   Sexual activity: Not Currently    Partners: Male  Other Topics Concern   Not on file  Social History Narrative   Scheduling/membership for [employer]   Two sons in [place]  3 grandbabys and 1 on the way   Lives with husband   Fun: sells [employer]   Social Drivers of Health   Financial Resource Strain: Low Risk  (12/27/2023)   Overall Financial Resource Strain (CARDIA)    Difficulty of Paying Living Expenses: Not hard at all  Food Insecurity: No Food Insecurity (12/27/2023)   Hunger Vital Sign    Worried About Running Out of Food in the Last Year: Never true    Ran Out of Food in the Last Year: Never true  Transportation Needs: No Transportation Needs (12/27/2023)    PRAPARE - Administrator, Civil Service (Medical): No    Lack of Transportation (Non-Medical): No  Physical Activity: Insufficiently Active (12/27/2023)   Exercise Vital Sign    Days of Exercise per Week: 5 days    Minutes of Exercise per Session: 10 min  Stress: No Stress Concern Present (12/27/2023)   [employer] of Occupational Health - Occupational Stress Questionnaire    Feeling of Stress: Not at all  Social Connections: Moderately Integrated (12/27/2023)   Social Connection and Isolation Panel    Frequency of Communication with Friends and Family: More than three times a week    Frequency of Social Gatherings with Friends and Family: More than three times a week    Attends Religious Services: More than 4 times per year    Active Member of [employer] or Organizations: No    Attends Engineer, structural: Never    Marital Status: Married    Tobacco Counseling Counseling given: Not Answered    Clinical Intake:  Pre-visit preparation completed: Yes  Pain : No/denies pain     BMI - recorded: 35.45 Nutritional Status: BMI > 30  Obese Nutritional Risks: None Diabetes: No  No results found for: HGBA1C   How often do you need to have someone help you when you read instructions, pamphlets, or other written materials from your doctor or pharmacy?: 1 - Never  Interpreter Needed?: No  Information entered by :: Ellouise Haws, LPN   Activities of Daily Living     12/27/2023   10:38 AM  In your present state of health, do you have any difficulty performing the following activities:  Hearing? 0  Vision? 0  Difficulty concentrating or making decisions? 0  Walking or climbing stairs? 0  Dressing or bathing? 0  Doing errands, shopping? 0  Preparing Food and eating ? N  Using the Toilet? N  In the past six months, have you accidently leaked urine? N  Do you have problems with loss of bowel control? N  Managing your Medications? N  Managing your Finances?  N  Housekeeping or managing your Housekeeping? N    Patient Care Team: Job Lukes, [place] as PCP - General (Physician Assistant) Rox Charleston, MD as Consulting Physician (Obstetrics and Gynecology)  I have updated your Care Teams any recent Medical Services you may have received from other providers in the past year.     Assessment:   This is a routine wellness examination for [place].  Hearing/Vision screen Hearing Screening - Comments:: Pt denies any hearing issues  Vision Screening - Comments:: Wears rx glasses - up to date with routine eye exams with Dr Darroll    Goals Addressed             This Visit's Progress    Patient Stated       Weight loss        Depression  Screen     12/27/2023   10:39 AM 12/26/2022    4:05 PM 05/10/2022   10:28 AM 02/07/2019    9:46 AM  PHQ 2/9 Scores  PHQ - 2 Score 0 0 0 0    Fall Risk     12/27/2023   10:40 AM 12/26/2022    4:07 PM  Fall Risk   Falls in the past year? 0 0  Number falls in past yr: 0 0  Injury with Fall? 0 0  Risk for fall due to : No Fall Risks Impaired vision  Follow up Falls prevention discussed Falls prevention discussed    MEDICARE RISK AT HOME:  Medicare Risk at Home Any stairs in or around the home?: Yes If so, are there any without handrails?: No Home free of loose throw rugs in walkways, pet beds, electrical cords, etc?: Yes Adequate lighting in your home to reduce risk of falls?: Yes Life alert?: No Use of a cane, walker or w/c?: No Grab bars in the bathroom?: No Shower chair or bench in shower?: No Elevated toilet seat or a handicapped toilet?: Yes  TIMED UP AND GO:  Was the test performed?  No  Cognitive Function: 6CIT completed        12/27/2023   10:41 AM 12/26/2022    4:09 PM  6CIT Screen  What Year? 0 points 0 points  What month? 0 points 0 points  What time? 0 points 0 points  Count back from 20 0 points 0 points  Months in reverse 0 points 0 points  Repeat phrase 0 points 0  points  Total Score 0 points 0 points    Immunizations Immunization History  Administered Date(s) Administered   Fluad Quad(high Dose 65+) 05/10/2022   Influenza,inj,Quad PF,6+ Mos 02/07/2019   Moderna SARS-COV2 Booster Vaccination 06/15/2020   Moderna Sars-Covid-2 Vaccination 08/07/2019, 09/08/2019   PNEUMOCOCCAL CONJUGATE-20 05/10/2022   Tdap 02/07/2019    Screening Tests Health Maintenance  Topic Date Due   Zoster Vaccines- Shingrix (1 of 2) Never done   COVID-19 Vaccine (4 - 2024-25 season) 01/21/2023   INFLUENZA VACCINE  12/21/2023   MAMMOGRAM  07/12/2024   Medicare Annual Wellness (AWV)  12/26/2024   Fecal DNA (Cologuard)  05/31/2025   DEXA SCAN  05/24/2026   DTaP/Tdap/Td (2 - Td or Tdap) 02/06/2029   Pneumococcal Vaccine: 50+ Years  Completed   Hepatitis C Screening  Completed   Hepatitis B Vaccines  Aged Out   HPV VACCINES  Aged Out   Meningococcal B Vaccine  Aged Out    Health Maintenance  Health Maintenance Due  Topic Date Due   Zoster Vaccines- Shingrix (1 of 2) Never done   COVID-19 Vaccine (4 - 2024-25 season) 01/21/2023   INFLUENZA VACCINE  12/21/2023   Health Maintenance Items Addressed: See Nurse Notes at the end of this note  Additional Screening:  Vision Screening: Recommended annual ophthalmology exams for early detection of glaucoma and other disorders of the eye. Would you like a referral to an eye doctor? No    Dental Screening: Recommended annual dental exams for proper oral hygiene  Community Resource Referral / Chronic Care Management: CRR required this visit?  No   CCM required this visit?  No   Plan:    I have personally reviewed and noted the following in the patient's chart:   Medical and social history Use of alcohol, tobacco or illicit drugs  Current medications and supplements including opioid prescriptions. Patient is not currently  taking opioid prescriptions. Functional ability and status Nutritional status Physical  activity Advanced directives List of other physicians Hospitalizations, surgeries, and ER visits in previous 12 months Vitals Screenings to include cognitive, depression, and falls Referrals and appointments  In addition, I have reviewed and discussed with patient certain preventive protocols, quality metrics, and best practice recommendations. A written personalized care plan for preventive services as well as general preventive health recommendations were provided to patient.   Ellouise VEAR Haws, LPN   05/23/7972   After Visit Summary: (MyChart) Due to this being a telephonic visit, the after visit summary with patients personalized plan was offered to patient via MyChart   Notes: Nothing significant to report at this time.

## 2024-01-01 ENCOUNTER — Ambulatory Visit: Payer: Medicare Other

## 2024-01-31 ENCOUNTER — Encounter: Payer: Self-pay | Admitting: Physician Assistant

## 2024-01-31 ENCOUNTER — Ambulatory Visit (INDEPENDENT_AMBULATORY_CARE_PROVIDER_SITE_OTHER): Admitting: Physician Assistant

## 2024-01-31 VITALS — BP 136/90 | HR 70 | Temp 97.6°F | Ht 63.75 in | Wt 225.0 lb

## 2024-01-31 DIAGNOSIS — R7989 Other specified abnormal findings of blood chemistry: Secondary | ICD-10-CM

## 2024-01-31 DIAGNOSIS — E669 Obesity, unspecified: Secondary | ICD-10-CM

## 2024-01-31 DIAGNOSIS — R030 Elevated blood-pressure reading, without diagnosis of hypertension: Secondary | ICD-10-CM

## 2024-01-31 DIAGNOSIS — E785 Hyperlipidemia, unspecified: Secondary | ICD-10-CM

## 2024-01-31 DIAGNOSIS — H18519 Endothelial corneal dystrophy, unspecified eye: Secondary | ICD-10-CM | POA: Insufficient documentation

## 2024-01-31 DIAGNOSIS — Z23 Encounter for immunization: Secondary | ICD-10-CM | POA: Diagnosis not present

## 2024-01-31 DIAGNOSIS — M8580 Other specified disorders of bone density and structure, unspecified site: Secondary | ICD-10-CM

## 2024-01-31 DIAGNOSIS — Z0000 Encounter for general adult medical examination without abnormal findings: Secondary | ICD-10-CM | POA: Diagnosis not present

## 2024-01-31 LAB — CBC WITH DIFFERENTIAL/PLATELET
Basophils Absolute: 0 K/uL (ref 0.0–0.1)
Basophils Relative: 0.6 % (ref 0.0–3.0)
Eosinophils Absolute: 0.2 K/uL (ref 0.0–0.7)
Eosinophils Relative: 2.6 % (ref 0.0–5.0)
HCT: 42.5 % (ref 36.0–46.0)
Hemoglobin: 14.2 g/dL (ref 12.0–15.0)
Lymphocytes Relative: 29.7 % (ref 12.0–46.0)
Lymphs Abs: 1.9 K/uL (ref 0.7–4.0)
MCHC: 33.4 g/dL (ref 30.0–36.0)
MCV: 93.2 fl (ref 78.0–100.0)
Monocytes Absolute: 0.5 K/uL (ref 0.1–1.0)
Monocytes Relative: 7.4 % (ref 3.0–12.0)
Neutro Abs: 3.8 K/uL (ref 1.4–7.7)
Neutrophils Relative %: 59.7 % (ref 43.0–77.0)
Platelets: 285 K/uL (ref 150.0–400.0)
RBC: 4.56 Mil/uL (ref 3.87–5.11)
RDW: 14.4 % (ref 11.5–15.5)
WBC: 6.4 K/uL (ref 4.0–10.5)

## 2024-01-31 LAB — LIPID PANEL
Cholesterol: 163 mg/dL (ref 0–200)
HDL: 48.5 mg/dL (ref >39.00)
LDL Cholesterol: 93 mg/dL (ref 0–99)
NonHDL: 114.26
Total CHOL/HDL Ratio: 3
Triglycerides: 105 mg/dL (ref 0.0–149.0)
VLDL: 21 mg/dL (ref 0.0–40.0)

## 2024-01-31 LAB — COMPREHENSIVE METABOLIC PANEL WITH GFR
ALT: 33 U/L (ref 0–35)
AST: 30 U/L (ref 0–37)
Albumin: 4.3 g/dL (ref 3.5–5.2)
Alkaline Phosphatase: 55 U/L (ref 39–117)
BUN: 15 mg/dL (ref 6–23)
CO2: 23 meq/L (ref 19–32)
Calcium: 10.1 mg/dL (ref 8.4–10.5)
Chloride: 107 meq/L (ref 96–112)
Creatinine, Ser: 0.9 mg/dL (ref 0.40–1.20)
GFR: 66.34 mL/min (ref >60.00)
Glucose, Bld: 91 mg/dL (ref 70–99)
Potassium: 3.9 meq/L (ref 3.5–5.1)
Sodium: 139 meq/L (ref 135–145)
Total Bilirubin: 0.5 mg/dL (ref 0.2–1.2)
Total Protein: 7.1 g/dL (ref 6.0–8.3)

## 2024-01-31 NOTE — Progress Notes (Signed)
 Subjective:    Erin Monroe is a 68 y.o. female and is here for a comprehensive physical exam.  HPI  There are no preventive care reminders to display for this patient.  Discussed the use of AI scribe software for clinical note transcription with the patient, who gave verbal consent to proceed.  History of Present Illness Erin Monroe is a 68 year old female who presents for a routine follow-up visit to maintain her health.  She monitors her blood pressure at home, with recent readings around 120/84 mmHg. She experiences ankle swelling after walking, which resolves with rest. She engages in regular physical activity, including walking and weight exercises, and has lost ten pounds in the past month. Her diet focuses on weight management, avoiding processed foods, and reducing red meat consumption.  She manages osteopenia with daily calcium and vitamin D supplements. Past episodes of waking up breathless have not occurred in several years, and she is unsure if they were related to sleep apnea. She has Fuchs corneal dystrophy, diagnosed at age 58, with symptoms of 'looking through water.'  Her family history includes breast cancer in a paternal aunt. Her parents and two sisters are healthy. She drinks alcohol socially, less frequently than before, and has never smoked. She drinks a lot of water and has not had any recent urinary tract infections. Previous liver labs showed slight elevation, possibly due to hepatic steatosis.    Health Maintenance: Immunizations -- UpToDate Colonoscopy -- UpToDate on Cologuard Mammogram -- overdue PAP -- n/a Bone Density -- UpToDate  Diet -- reduced processed foods Exercise -- walks daily for regular exercise  Sleep habits -- no concerns Mood -- overall stable  UTD with dentist? - no UTD with eye doctor? - yes  Weight history: Wt Readings from Last 10 Encounters:  01/31/24 225 lb (102.1 kg)  12/27/23 213 lb (96.6 kg)  12/26/22 213 lb  (96.6 kg)  08/09/22 213 lb 6.4 oz (96.8 kg)  05/10/22 221 lb 4 oz (100.4 kg)  05/09/19 208 lb 6.4 oz (94.5 kg)  02/07/19 209 lb 12.8 oz (95.2 kg)   Body mass index is 38.92 kg/m. No LMP recorded (lmp unknown). Patient is postmenopausal.  Alcohol use:  reports current alcohol use.  Tobacco use:  Tobacco Use: Low Risk  (01/31/2024)   Patient History    Smoking Tobacco Use: Never    Smokeless Tobacco Use: Never    Passive Exposure: Not on file   Eligible for lung cancer screening? no     12/27/2023   10:39 AM  Depression screen PHQ 2/9  Decreased Interest 0  Down, Depressed, Hopeless 0  PHQ - 2 Score 0     Other providers/specialists: Patient Care Team: Job Lukes, [place] as PCP - General (Physician Assistant) Rox Charleston, MD as Consulting Physician (Obstetrics and Gynecology)    PMHx, SurgHx, SocialHx, Medications, and Allergies were reviewed in the Visit Navigator and updated as appropriate.   Past Medical History:  Diagnosis Date   GERD (gastroesophageal reflux disease)    High blood pressure    UTI (urinary tract infection)      Past Surgical History:  Procedure Laterality Date   CESAREAN SECTION  1988     Family History  Problem Relation Age of Onset   Depression Sister    Alcohol abuse Maternal Grandfather    Breast cancer Paternal Aunt    Cancer Neg Hx     Social History   Tobacco Use   Smoking status:  Never   Smokeless tobacco: Never  Vaping Use   Vaping status: Never Used  Substance Use Topics   Alcohol use: Yes    Comment: socially   Drug use: Never    Review of Systems:   Review of Systems  Constitutional:  Negative for chills, fever, malaise/fatigue and weight loss.  HENT:  Negative for hearing loss, sinus pain and sore throat.   Respiratory:  Negative for cough and hemoptysis.   Cardiovascular:  Negative for chest pain, palpitations, leg swelling and PND.  Gastrointestinal:  Negative for abdominal pain, constipation, diarrhea,  heartburn, nausea and vomiting.  Genitourinary:  Negative for dysuria, frequency and urgency.  Musculoskeletal:  Negative for back pain, myalgias and neck pain.  Skin:  Negative for itching and rash.  Neurological:  Negative for dizziness, tingling, seizures and headaches.  Endo/Heme/Allergies:  Negative for polydipsia.  Psychiatric/Behavioral:  Negative for depression. The patient is not nervous/anxious.     Objective:   BP (!) 136/90 (BP Location: Left Arm, Patient Position: Sitting, Cuff Size: Large)   Pulse 70   Temp 97.6 F (36.4 C) (Temporal)   Ht 5' 3.75 (1.619 m)   Wt 225 lb (102.1 kg)   LMP  (LMP Unknown) Comment: LMP 2010  SpO2 97%   BMI 38.92 kg/m  Body mass index is 38.92 kg/m.   General Appearance:    Alert, cooperative, no distress, appears stated age  Head:    Normocephalic, without obvious abnormality, atraumatic  Eyes:    PERRL, conjunctiva/corneas clear, EOM's intact, fundi    benign, both eyes  Ears:    Normal TM's and external ear canals, both ears  Nose:   Nares normal, septum midline, mucosa normal, no drainage    or sinus tenderness  Throat:   Lips, mucosa, and tongue normal; teeth and gums normal  Neck:   Supple, symmetrical, trachea midline, no adenopathy;    thyroid:  no enlargement/tenderness/nodules; no carotid   bruit or JVD  Back:     Symmetric, no curvature, ROM normal, no CVA tenderness  Lungs:     Clear to auscultation bilaterally, respirations unlabored  Chest Wall:    No tenderness or deformity   Heart:    Regular rate and rhythm, S1 and S2 normal, no murmur, rub or gallop  Breast Exam:    Deferred  Abdomen:     Soft, non-tender, bowel sounds active all four quadrants,    no masses, no organomegaly  Genitalia:    Deferred  Extremities:   Extremities normal, atraumatic, no cyanosis or edema  Pulses:   2+ and symmetric all extremities  Skin:   Skin color, texture, turgor normal, no rashes or lesions  Lymph nodes:   Cervical,  supraclavicular, and axillary nodes normal  Neurologic:   CNII-XII intact, normal strength, sensation and reflexes    throughout    Assessment/Plan:   Assessment and Plan Assessment & Plan Adult Wellness Visit Routine wellness visit with focus on health maintenance through exercise and diet. - Continue current exercise regimen, including walking and weight exercises. - Encourage continued healthy eating habits, reducing processed foods and red meat. - Discussed weight loss options, including upcoming oral weight loss medication, but she prefers lifestyle changes.  Elevated blood pressure reading Blood pressure well-controlled at home, averaging 120/84 mmHg. - Advise to seek medical attention if blood pressure consistently reads 140/90 mmHg or higher. - Encourage reduction of salt intake.  Elevated liver function tests Previous tests showed slight elevation, likely due to hepatic  steatosis. Overweight status may contribute. - Recheck liver function tests and further evaluation to be decided   Obesity Lost 10 pounds through lifestyle changes. Prefers diet and exercise over medication for weight management. - Continue current weight loss efforts through diet and exercise. - Discussed potential future use of weight loss medications if desired.  Osteopenia Confirmed by DEXA scan. No progression noted, does not qualify for medication based on fracture risk. - Continue calcium and vitamin D supplementation.      Lucie Buttner, PA-C  [facility]

## 2024-01-31 NOTE — Patient Instructions (Signed)
 It was great to see you!  Please go to the lab for blood work.   Our office will call you with your results unless you have chosen to receive results via MyChart.  If your blood work is normal we will follow-up each year for physicals and as scheduled for chronic medical problems.  Your blood pressure is elevated in our office today.  I recommend that you monitor this at home.  Your goal blood pressure should be around < 130/80, unless you are over 78 years old, your goal may be closer to 140-150/90. Please note if you have been given other goals from a cardiologist or other healthcare provider, please defer to their recommendations.  When preparing to take your blood pressure: Plan ahead. Don't smoke, drink caffeine or exercise within 30 minutes before taking your blood pressure. Empty your bladder. Don't take the measurement over clothes. Remove the clothing over the arm that will be used to measure blood pressure. You can use either arm unless otherwise told by a healthcare provider. Usually there is not a big difference between readings on them. Be still. Allow at least five minutes of quiet rest before measurements. Don't talk or use the phone. Sit correctly. Sit with your back straight and supported (on a dining chair, rather than a sofa). Your feet should be flat on the floor. Do not cross your legs. Support your arm on a flat surface. The middle of the cuff should be placed on the upper arm at heart level.  Measure at the same time of the day. Take multiple readings and record the results. Each time you measure, take two readings one minute apart. Record the results and bring in to your next office visit.  In order to know how well the medication is working, I would like you to take your readings 1-2 hours after taking your blood pressure medication if possible. Take your blood pressure measurements and record 2-3 days per week.  If you get a high blood pressure reading: A single  high reading is not an immediate cause for alarm. If you get a reading that is higher than normal, take your blood pressure a second time. Write down the results of both measurements. Check with your health care professional to see if there's a health concern or whether there may be problems with your monitor. If your blood pressure readings are suddenly higher than 180/120 mm Hg, wait at least one minute and test again. If your readings are still very high, contact your health care professional immediately. You could be having a hypertensive crisis. Call 911 if your blood pressure is higher than 180/120 mm Hg and if you are having new signs or symptoms that may include: Chest pain Shortness of breath Back pain Numbness Weakness Change in vision Difficulty speaking Confusion Dizziness Vomiting       If anything is abnormal we will treat accordingly and get you in for a follow-up.  Take care,  Ayaansh Smail

## 2024-02-01 ENCOUNTER — Ambulatory Visit: Payer: Self-pay | Admitting: Physician Assistant

## 2024-03-07 DIAGNOSIS — K219 Gastro-esophageal reflux disease without esophagitis: Secondary | ICD-10-CM | POA: Diagnosis not present

## 2025-03-06 ENCOUNTER — Encounter: Admitting: Physician Assistant
